# Patient Record
Sex: Female | Born: 1945
Health system: Southern US, Community
[De-identification: ages and names within clinical notes are randomized; demographics above are authoritative.]

## PROBLEM LIST (undated history)

## (undated) DIAGNOSIS — G629 Polyneuropathy, unspecified: Secondary | ICD-10-CM

## (undated) DIAGNOSIS — M1990 Unspecified osteoarthritis, unspecified site: Secondary | ICD-10-CM

## (undated) DIAGNOSIS — E669 Obesity, unspecified: Secondary | ICD-10-CM

## (undated) DIAGNOSIS — K219 Gastro-esophageal reflux disease without esophagitis: Secondary | ICD-10-CM

## (undated) DIAGNOSIS — I10 Essential (primary) hypertension: Secondary | ICD-10-CM

## (undated) DIAGNOSIS — R7303 Prediabetes: Secondary | ICD-10-CM

## (undated) HISTORY — DX: Gastro-esophageal reflux disease without esophagitis: K21.9

## (undated) HISTORY — DX: Essential (primary) hypertension: I10

## (undated) HISTORY — DX: Obesity, unspecified: E66.9

## (undated) HISTORY — DX: Prediabetes: R73.03

## (undated) HISTORY — PX: MOUTH SURGERY: SHX715

## (undated) HISTORY — DX: Unspecified osteoarthritis, unspecified site: M19.90

---

## 2010-12-19 HISTORY — PX: COLONOSCOPY: SHX5424

## 2012-01-10 ENCOUNTER — Observation Stay: Payer: Self-pay | Admitting: Student

## 2012-01-10 LAB — COMPREHENSIVE METABOLIC PANEL
Albumin: 3.9 g/dL (ref 3.4–5.0)
Anion Gap: 14 (ref 7–16)
BUN: 28 mg/dL — ABNORMAL HIGH (ref 7–18)
Bilirubin,Total: 0.4 mg/dL (ref 0.2–1.0)
Calcium, Total: 9.8 mg/dL (ref 8.5–10.1)
Chloride: 102 mmol/L (ref 98–107)
Creatinine: 0.83 mg/dL (ref 0.60–1.30)
EGFR (African American): >60
EGFR (Non-African Amer.): >60
Glucose: 135 mg/dL — ABNORMAL HIGH (ref 65–99)
Potassium: 3.6 mmol/L (ref 3.5–5.1)
SGPT (ALT): 21 U/L
Total Protein: 7.4 g/dL (ref 6.4–8.2)

## 2012-01-10 LAB — CBC
HCT: 41.8 % (ref 35.0–47.0)
HGB: 13.9 g/dL (ref 12.0–16.0)
MCHC: 33.2 g/dL (ref 32.0–36.0)
MCV: 90 fL (ref 80–100)
Platelet: 219 10*3/uL (ref 150–440)
RDW: 12.8 % (ref 11.5–14.5)
WBC: 13 10*3/uL — ABNORMAL HIGH (ref 3.6–11.0)

## 2012-01-11 LAB — BASIC METABOLIC PANEL
Anion Gap: 9 (ref 7–16)
Calcium, Total: 8.6 mg/dL (ref 8.5–10.1)
Chloride: 106 mmol/L (ref 98–107)
Co2: 30 mmol/L (ref 21–32)
Creatinine: 0.68 mg/dL (ref 0.60–1.30)
EGFR (Non-African Amer.): >60
Potassium: 3.5 mmol/L (ref 3.5–5.1)

## 2012-01-11 LAB — CBC WITH DIFFERENTIAL/PLATELET
Eosinophil #: 0.1 10*3/uL (ref 0.0–0.7)
HCT: 39.8 % (ref 35.0–47.0)
Lymphocyte %: 22.9 %
MCH: 29.9 pg (ref 26.0–34.0)
MCHC: 32.7 g/dL (ref 32.0–36.0)
Monocyte #: 0.8 10*3/uL — ABNORMAL HIGH (ref 0.0–0.7)
Neutrophil %: 69.1 %
Platelet: 209 10*3/uL (ref 150–440)
RDW: 13 % (ref 11.5–14.5)

## 2012-01-11 LAB — HEMOGLOBIN A1C: Hemoglobin A1C: 6.1 % (ref 4.2–6.3)

## 2012-07-19 ENCOUNTER — Ambulatory Visit: Payer: Self-pay

## 2012-07-19 LAB — URINALYSIS, COMPLETE
Nitrite: NEGATIVE
Protein: NEGATIVE
Specific Gravity: 1.01 (ref 1.003–1.030)

## 2012-07-20 LAB — URINE CULTURE

## 2013-09-12 ENCOUNTER — Ambulatory Visit: Payer: Self-pay | Admitting: Emergency Medicine

## 2013-09-12 LAB — RAPID STREP-A WITH REFLX: Micro Text Report: NEGATIVE

## 2013-09-15 LAB — BETA STREP CULTURE(ARMC)

## 2014-02-07 DIAGNOSIS — R109 Unspecified abdominal pain: Secondary | ICD-10-CM | POA: Diagnosis not present

## 2014-02-07 DIAGNOSIS — Z880 Allergy status to penicillin: Secondary | ICD-10-CM | POA: Diagnosis not present

## 2014-02-07 DIAGNOSIS — B379 Candidiasis, unspecified: Secondary | ICD-10-CM | POA: Diagnosis not present

## 2014-02-07 DIAGNOSIS — Z885 Allergy status to narcotic agent status: Secondary | ICD-10-CM | POA: Diagnosis not present

## 2014-02-07 DIAGNOSIS — M549 Dorsalgia, unspecified: Secondary | ICD-10-CM | POA: Diagnosis not present

## 2014-02-07 DIAGNOSIS — R319 Hematuria, unspecified: Secondary | ICD-10-CM | POA: Diagnosis not present

## 2014-02-07 DIAGNOSIS — R1012 Left upper quadrant pain: Secondary | ICD-10-CM | POA: Diagnosis not present

## 2014-03-17 DIAGNOSIS — I10 Essential (primary) hypertension: Secondary | ICD-10-CM | POA: Diagnosis not present

## 2014-03-17 DIAGNOSIS — M1710 Unilateral primary osteoarthritis, unspecified knee: Secondary | ICD-10-CM | POA: Diagnosis not present

## 2014-03-17 DIAGNOSIS — F17200 Nicotine dependence, unspecified, uncomplicated: Secondary | ICD-10-CM | POA: Diagnosis not present

## 2014-03-17 DIAGNOSIS — Z885 Allergy status to narcotic agent status: Secondary | ICD-10-CM | POA: Diagnosis not present

## 2014-04-01 DIAGNOSIS — I10 Essential (primary) hypertension: Secondary | ICD-10-CM | POA: Insufficient documentation

## 2014-04-09 DIAGNOSIS — I10 Essential (primary) hypertension: Secondary | ICD-10-CM | POA: Diagnosis not present

## 2014-04-09 DIAGNOSIS — Z885 Allergy status to narcotic agent status: Secondary | ICD-10-CM | POA: Diagnosis not present

## 2014-04-09 DIAGNOSIS — M21169 Varus deformity, not elsewhere classified, unspecified knee: Secondary | ICD-10-CM | POA: Diagnosis not present

## 2014-04-09 DIAGNOSIS — IMO0002 Reserved for concepts with insufficient information to code with codable children: Secondary | ICD-10-CM | POA: Diagnosis not present

## 2014-04-09 DIAGNOSIS — M1710 Unilateral primary osteoarthritis, unspecified knee: Secondary | ICD-10-CM | POA: Diagnosis not present

## 2014-04-09 DIAGNOSIS — Z880 Allergy status to penicillin: Secondary | ICD-10-CM | POA: Diagnosis not present

## 2014-04-09 DIAGNOSIS — Z79899 Other long term (current) drug therapy: Secondary | ICD-10-CM | POA: Diagnosis not present

## 2014-06-12 DIAGNOSIS — Z6841 Body Mass Index (BMI) 40.0 and over, adult: Secondary | ICD-10-CM | POA: Insufficient documentation

## 2014-06-12 DIAGNOSIS — IMO0002 Reserved for concepts with insufficient information to code with codable children: Secondary | ICD-10-CM | POA: Insufficient documentation

## 2014-07-01 DIAGNOSIS — I10 Essential (primary) hypertension: Secondary | ICD-10-CM | POA: Diagnosis not present

## 2014-07-01 DIAGNOSIS — Z6841 Body Mass Index (BMI) 40.0 and over, adult: Secondary | ICD-10-CM | POA: Diagnosis not present

## 2014-07-29 DIAGNOSIS — Z6841 Body Mass Index (BMI) 40.0 and over, adult: Secondary | ICD-10-CM | POA: Diagnosis not present

## 2014-08-12 DIAGNOSIS — Z6841 Body Mass Index (BMI) 40.0 and over, adult: Secondary | ICD-10-CM | POA: Diagnosis not present

## 2014-08-13 IMAGING — CR DG CHEST 2V
1 series · 2 of 2 positions shown · non-contrast
Comparison: none

REASON FOR EXAM: cough and SOB
COMMENTS:   LMP: Post-Menopausal

PROCEDURE:     MDR - MDR CHEST PA(OR AP) AND LATERAL  - September 12, 2013  [DATE]
RESULT:     The lungs are clear. The heart and pulmonary vessels are normal.
The bony and mediastinal structures are unremarkable. There is no effusion.
There is no pneumothorax or evidence of congestive failure.

[Series 1: pa · 0.17mm/px · 2 of 2 slices shown]
[im 1/2]
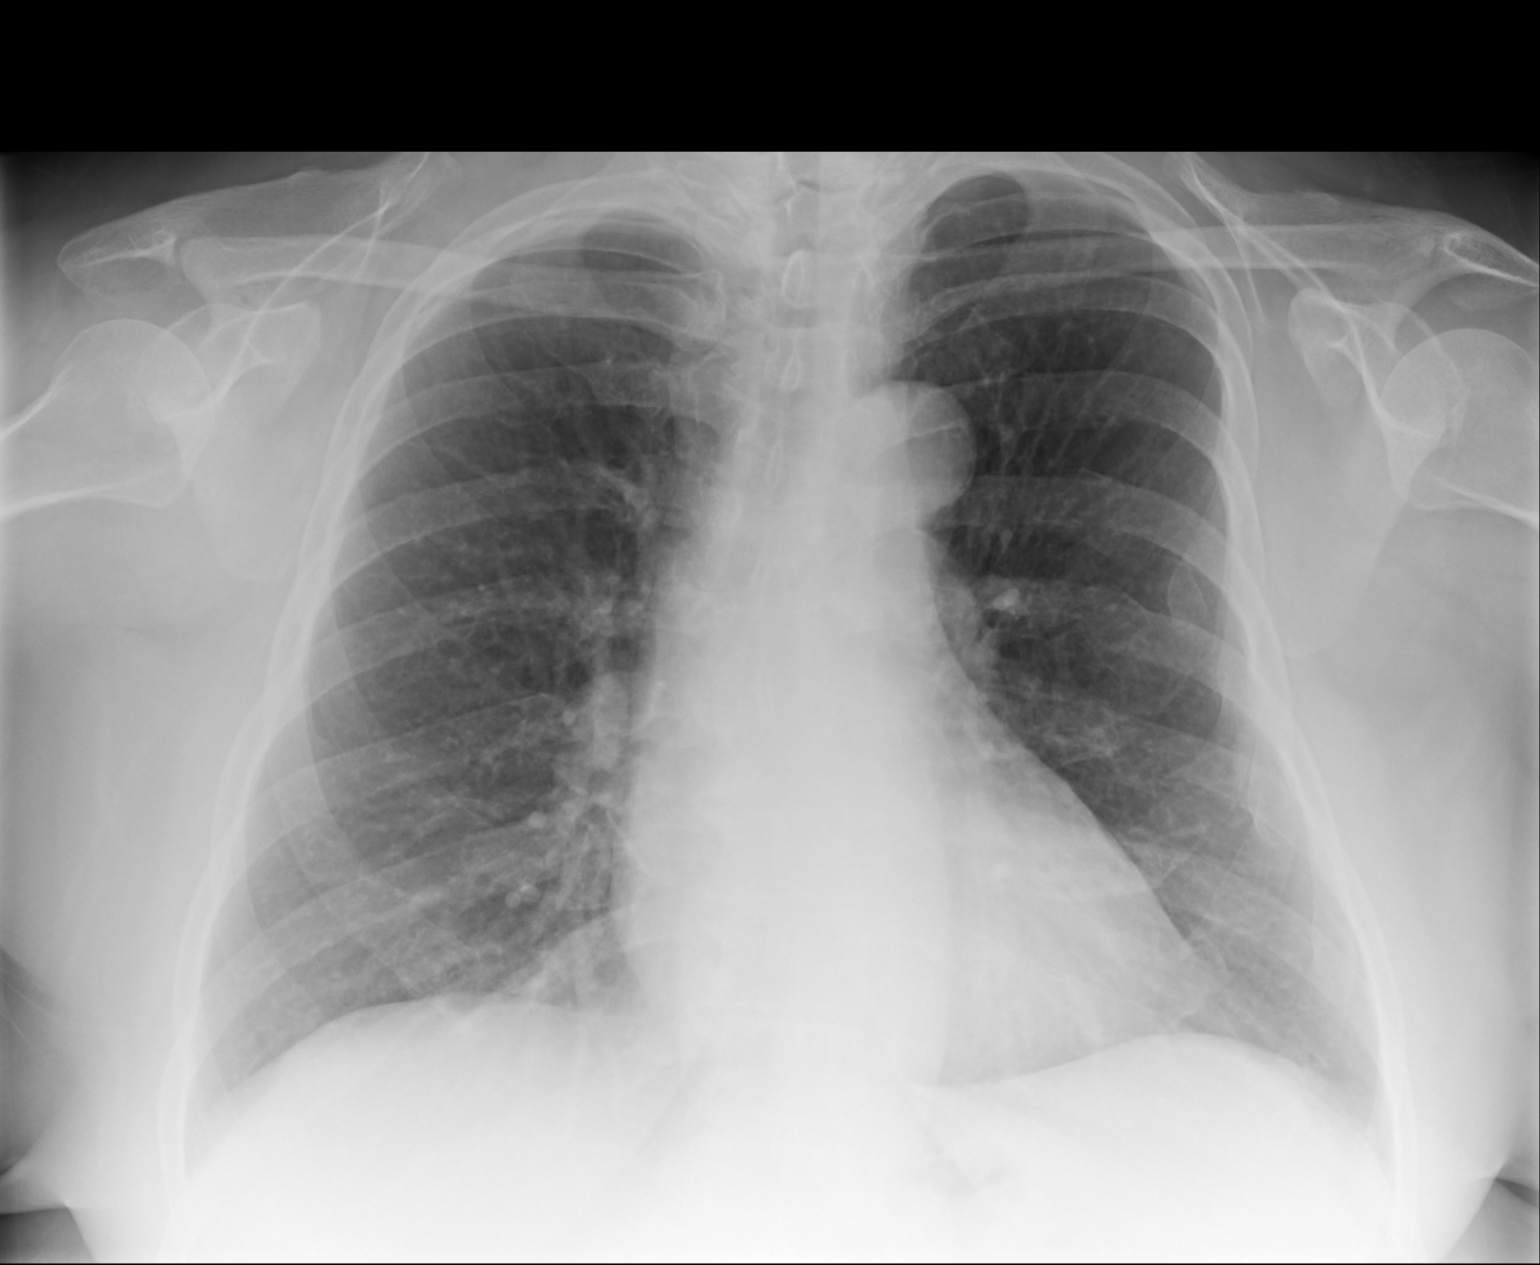
[im 2/2]
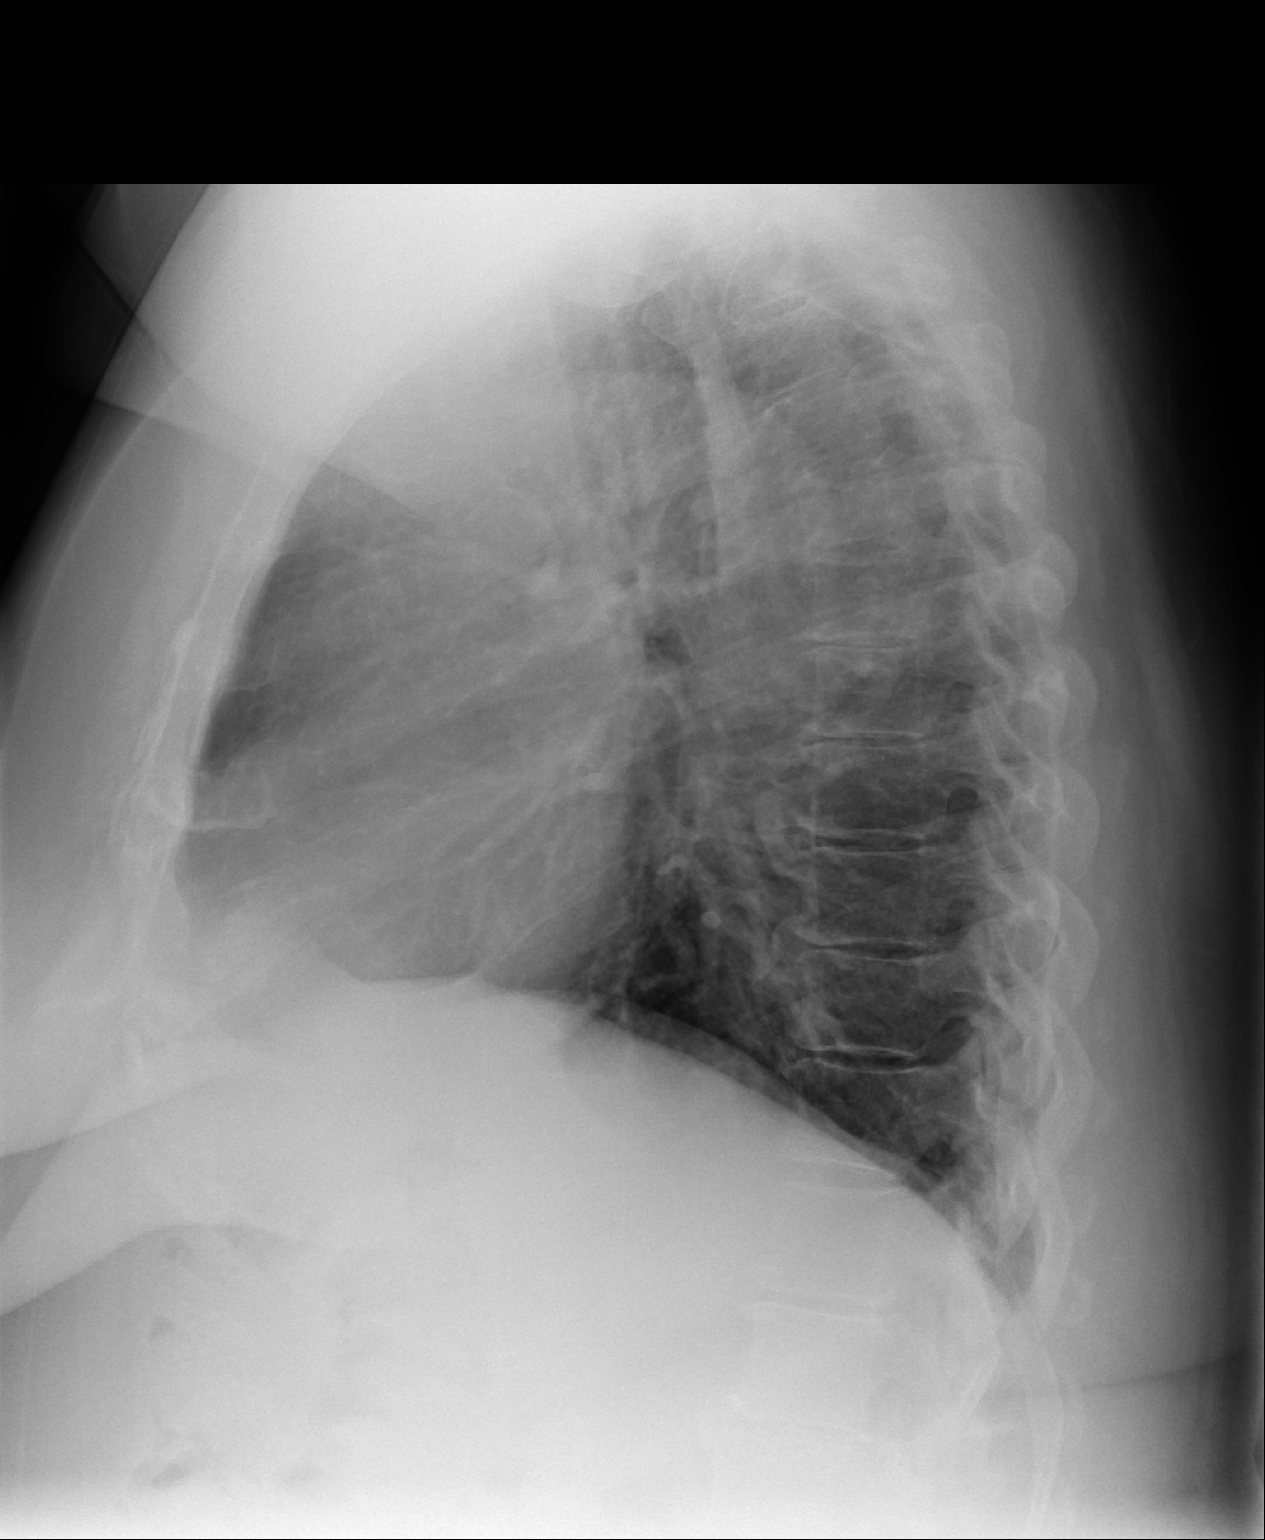

[2 of 2 positions shown; findings below may reference images not displayed]

IMPRESSION: No acute cardiopulmonary disease.

[REDACTED]

## 2014-08-26 DIAGNOSIS — Z6841 Body Mass Index (BMI) 40.0 and over, adult: Secondary | ICD-10-CM | POA: Diagnosis not present

## 2014-10-02 DIAGNOSIS — Z6841 Body Mass Index (BMI) 40.0 and over, adult: Secondary | ICD-10-CM | POA: Diagnosis not present

## 2015-04-12 NOTE — Discharge Summary (Signed)
PATIENT NAME:  Lydia DuncansWATZLAWICK, Juvia G MR#:  [id number] DATE OF BIRTH:  1946-06-30  DATE OF ADMISSION:  01/10/2012 DATE OF DISCHARGE:  01/11/2012  DIAGNOSIS: Benign positional vertigo, dizziness, otitis media.   MEDICATIONS:  1. Meclizine 25 mg every eight hours as needed for dizziness.  2. Fosinopril 40 mg daily.  3. Hydrochlorothiazide 25 mg daily.  4. Nucynta 75 mg daily.  5. Levaquin 500 mg daily for six days.   DIET: Low sodium.   ACTIVITY: As tolerated.   FOLLOWUP: Please follow up with your physician in 1 to 2 weeks upon discharge if symptoms are persistent or recurrent.   HISTORY OF PRESENT ILLNESS: For full history and physical, please see the dictation from the 22nd of January by Dr. Renae GlossWieting. Briefly, this is a 69 year old female who has been having cold symptoms for several weeks and presented with dizziness, nausea, vomiting, and symptoms of vertigo, and was admitted to the hospitalist service for further evaluation and management.   SIGNIFICANT LABS/STUDIES: Glucose on arrival 138, BUN 28, creatinine 0.83. LFTs within normal limits on arrival. Hemoglobin A1c 6.1. Troponin negative times one. Initial WBC 13, hemoglobin 13.9, hematocrit 41.8. WBC on discharge 10.9. CT of the head without contrast showing no acute intracranial abnormality.   HOSPITAL COURSE: The patient was admitted for observation and started on IV fluids, meclizine, and also Levaquin as the patient had evidence of otitis media with some leukocytosis. Likely cause of vertigo is benign positional vertigo from otitis media, which did improve on the second day of hospitalization. Leukocytosis trended down and the patient had no fever. She is to continue Levaquin for another six days as well as meclizine as needed. If the patient's symptoms do not improve or progress, she is to follow up [facility]. This [place]. We set her up with a followup in March at the  [facility] [place]; however, the patient was told that if she has any worsening of the symptoms she can go to the  walk-in clinic and she will comply. Her nausea and vomiting likely is in the setting of above, which resolved. She has had a regular diet and is tolerating it well. For high blood pressure her outpatient medications were continued. As her symptoms have dramatically improved and she was seen by physical therapy, the recommendation was to discharge home. We will go ahead and discharge her with outpatient followup.   CODE STATUS: The patient is FULL CODE.   TOTAL TIME SPENT: 35 minutes.    ____________________________ Krystal EatonShayiq Dyllin Gulley, MD sa:bjt D: 01/11/2012 16:11:42 ET T: 01/12/2012 12:10:00 ET JOB#: [id number]  cc: Krystal EatonShayiq Betti Goodenow, MD, <Dictator> Krystal EatonSHAYIQ Jeray Shugart MD ELECTRONICALLY SIGNED 01/17/2012 18:36

## 2015-04-12 NOTE — H&P (Signed)
PATIENT NAME:  Lydia, Beard MR#:  [id number] DATE OF BIRTH:  Apr 12, 1946  DATE OF ADMISSION:  01/10/2012  PRIMARY CARE PHYSICIAN: None.  CHIEF COMPLAINT: Dizziness.  HISTORY OF PRESENT ILLNESS: This is a 69 year old woman that has been battling a cold for a while. She woke up this morning secondary to dizziness. It occurred while lying down. She felt like she was falling. She tried to stand but had to hold on. She then started to vomit about 12 times with dry heaves. She felt like she was spinning and she had to walk but needed help in order to do so. She has been battling a cold for a little while and feels like she did get over it.   PAST MEDICAL HISTORY:  1. Hypertension. 2. Arthritis in the knees. 3. Herniated disk. 4. Myopia.  5. Obesity.   PAST SURGICAL HISTORY: Dental surgery.   ALLERGIES: Penicillin and codeine; codeine causes nausea.   MEDICATIONS:  1. Fosinopril 20 mg daily.  2. Hydrochlorothiazide 25 mg daily. 3. Nucynta, unknown dose daily.   SOCIAL HISTORY: No smoking. Occasional alcoholic beverage. No drug use. Currently unemployed with recent move into the area.   FAMILY HISTORY: Mother died at 84 of unknown cause. She did have vertigo and diabetes. Father died at 62 of a myocardial infarction and had hypertension and diabetes.  REVIEW OF SYSTEMS: CONSTITUTIONAL: Positive for chills. Positive for sweats with the vomiting. No fever. Positive for weight loss over the past year. Positive for fatigue. EYES: She does have glasses. EARS, NOSE, MOUTH, AND THROAT: Positive for runny nose. No sore throat. No difficulty swallowing. CARDIOVASCULAR: No chest pain. No palpitations. RESPIRATORY: Positive for cough with clear phlegm. GASTROINTESTINAL: Positive for nausea. Positive for vomiting. No hematemesis. No abdominal pain. No bright red blood per rectum. No melena. No constipation. No diarrhea. GENITOURINARY: No burning on urination. No hematuria. MUSCULOSKELETAL: Positive for  arthritis pain in the knees and ankles. NEUROLOGIC: No fainting or blackouts, but dizziness. PSYCHIATRIC: Positive for anxiety and depression. ENDOCRINE: No thyroid problems. HEMATOLOGIC/LYMPHATIC: No anemia. No easy bruising or bleeding.   PHYSICAL EXAMINATION:   VITAL SIGNS: Temperature 96, pulse 79, respirations 16, blood pressure 143/79, and pulse oximetry 91%.   GENERAL: No respiratory distress.   EYES: Conjunctivae and lids normal. Pupils equal, round, and reactive to light. Extraocular muscles intact with slight nystagmus.   EARS, NOSE, MOUTH, AND THROAT: Tympanic membranes bulging and erythematous bilaterally. Nasal mucosa no erythema. Throat no erythema. No exudate seen. Lips and gums normal.   NECK: No JVD. No bruits. No lymphadenopathy. No thyromegaly. No thyroid nodules palpated.   LUNGS: Lungs are clear to auscultation. No use of accessory muscles to breathe. No rhonchi, rales, or wheeze heard.   HEART: S1 and S2 normal. No gallops, rubs, or murmurs heard. Carotid upstroke 2+ bilaterally. No bruits. Dorsalis pedis pulses 2+ bilaterally. Trace edema of the lower extremities.   ABDOMEN: Soft and nontender. No organomegaly/splenomegaly. Normoactive bowel sounds. No masses felt.   LYMPHATIC: No lymph nodes in the neck.   MUSCULOSKELETAL: No clubbing or cyanosis. Trace edema.  SKIN: No rashes or ulcers seen.   NEUROLOGIC: Cranial nerves II through XII are grossly intact. Deep tendon reflexes 1+ in bilateral lower extremities. Babinski negative. Just sitting the patient up brought on the dizziness and had to lie down very quickly.   PSYCHIATRIC: The patient is oriented to person, place, and time.   LABS/STUDIES: CT scan of the head showed no acute intracranial abnormality.  White blood cell count 13.0, hemoglobin and hematocrit 13.9 and 41.8, and platelet count 219. Glucose 135, BUN 28, creatinine 0.83, sodium 144, potassium 3.6, chloride 102, CO2 28, and calcium 9.8. Liver  function tests are normal range. Troponin negative.   ASSESSMENT AND PLAN:  1. Vertigo, most likely benign positional vertigo caused by otitis media: We will give Levaquin 500 mg p.o. daily. We will give IV fluid hydration and meclizine standing dose. We will get a physical therapy evaluation since the patient is hardly able to lift her head up off the pillow. PRN nausea medications.  2. Nausea and vomiting: We will give p.r.n. Zofran. 3. Leukocytosis, most likely secondary to the vomiting.  4. Hypertension: Continue fosinopril and hydrochlorothiazide.  5. Arthritis: She is on Nucynta.  6. Obesity with BMI of 45.2: The patient is trying to lose weight.  7. Impaired fasting glucose: We will check a Hemoglobin A1c.  TIME SPENT ON ADMISSION: 50 minutes. ____________________________ Herschell Dimesichard J. Renae GlossWieting, MD rjw:slb D: 01/10/2012 11:10:45 ET     T: 01/10/2012 11:49:07 ET       JOB#: [id number] cc: Herschell Dimesichard J. Renae GlossWieting, MD, <Dictator> Salley ScarletICHARD J Amiree No MD ELECTRONICALLY SIGNED 01/12/2012 15:57

## 2015-09-16 ENCOUNTER — Ambulatory Visit (INDEPENDENT_AMBULATORY_CARE_PROVIDER_SITE_OTHER): Payer: Medicare Other | Admitting: Family Medicine

## 2015-09-16 ENCOUNTER — Encounter: Payer: Self-pay | Admitting: Family Medicine

## 2015-09-16 VITALS — BP 130/100 | HR 80 | Ht 67.0 in | Wt 336.0 lb

## 2015-09-16 DIAGNOSIS — R7309 Other abnormal glucose: Secondary | ICD-10-CM | POA: Diagnosis not present

## 2015-09-16 DIAGNOSIS — H6982 Other specified disorders of Eustachian tube, left ear: Secondary | ICD-10-CM | POA: Diagnosis not present

## 2015-09-16 DIAGNOSIS — F32A Depression, unspecified: Secondary | ICD-10-CM

## 2015-09-16 DIAGNOSIS — Z23 Encounter for immunization: Secondary | ICD-10-CM

## 2015-09-16 DIAGNOSIS — K219 Gastro-esophageal reflux disease without esophagitis: Secondary | ICD-10-CM

## 2015-09-16 DIAGNOSIS — E559 Vitamin D deficiency, unspecified: Secondary | ICD-10-CM | POA: Diagnosis not present

## 2015-09-16 DIAGNOSIS — I10 Essential (primary) hypertension: Secondary | ICD-10-CM

## 2015-09-16 DIAGNOSIS — F329 Major depressive disorder, single episode, unspecified: Secondary | ICD-10-CM

## 2015-09-16 DIAGNOSIS — M1710 Unilateral primary osteoarthritis, unspecified knee: Secondary | ICD-10-CM | POA: Insufficient documentation

## 2015-09-16 DIAGNOSIS — R739 Hyperglycemia, unspecified: Secondary | ICD-10-CM | POA: Diagnosis not present

## 2015-09-16 DIAGNOSIS — M170 Bilateral primary osteoarthritis of knee: Secondary | ICD-10-CM | POA: Diagnosis not present

## 2015-09-16 DIAGNOSIS — R7303 Prediabetes: Secondary | ICD-10-CM

## 2015-09-16 DIAGNOSIS — M179 Osteoarthritis of knee, unspecified: Secondary | ICD-10-CM | POA: Insufficient documentation

## 2015-09-16 DIAGNOSIS — R600 Localized edema: Secondary | ICD-10-CM

## 2015-09-16 MED ORDER — HYDROCHLOROTHIAZIDE 25 MG PO TABS: 25.0000 mg | ORAL_TABLET | Freq: Every day | ORAL | Status: DC

## 2015-09-16 MED ORDER — ACETAMINOPHEN 500 MG PO TABS: 1000.0000 mg | ORAL_TABLET | Freq: Two times a day (BID) | ORAL | Status: DC

## 2015-09-16 MED ORDER — FOSINOPRIL SODIUM 40 MG PO TABS: 40.0000 mg | ORAL_TABLET | Freq: Every day | ORAL | Status: DC

## 2015-09-17 DIAGNOSIS — F329 Major depressive disorder, single episode, unspecified: Secondary | ICD-10-CM | POA: Insufficient documentation

## 2015-09-17 DIAGNOSIS — R7303 Prediabetes: Secondary | ICD-10-CM | POA: Insufficient documentation

## 2015-09-17 DIAGNOSIS — F32A Depression, unspecified: Secondary | ICD-10-CM | POA: Insufficient documentation

## 2015-09-17 LAB — COMPREHENSIVE METABOLIC PANEL
ALK PHOS: 93 IU/L (ref 39–117)
ALT: 19 IU/L (ref 0–32)
AST: 20 IU/L (ref 0–40)
Albumin/Globulin Ratio: 1.7 (ref 1.1–2.5)
Albumin: 4.7 g/dL (ref 3.6–4.8)
BUN/Creatinine Ratio: 27 — ABNORMAL HIGH (ref 11–26)
BUN: 25 mg/dL (ref 8–27)
Bilirubin Total: 0.3 mg/dL (ref 0.0–1.2)
CO2: 25 mmol/L (ref 18–29)
CREATININE: 0.92 mg/dL (ref 0.57–1.00)
Calcium: 10.1 mg/dL (ref 8.7–10.3)
Chloride: 101 mmol/L (ref 97–108)
GFR calc Af Amer: 75 mL/min/{1.73_m2} (ref >59)
GFR calc non Af Amer: 65 mL/min/{1.73_m2} (ref >59)
GLUCOSE: 100 mg/dL — AB (ref 65–99)
Globulin, Total: 2.8 g/dL (ref 1.5–4.5)
Potassium: 4.7 mmol/L (ref 3.5–5.2)
Sodium: 143 mmol/L (ref 134–144)
Total Protein: 7.5 g/dL (ref 6.0–8.5)

## 2015-09-17 LAB — CBC
HEMATOCRIT: 44.8 % (ref 34.0–46.6)
Hemoglobin: 15.1 g/dL (ref 11.1–15.9)
MCH: 30.5 pg (ref 26.6–33.0)
MCHC: 33.7 g/dL (ref 31.5–35.7)
MCV: 91 fL (ref 79–97)
Platelets: 284 10*3/uL (ref 150–379)
RBC: 4.95 x10E6/uL (ref 3.77–5.28)
RDW: 13.3 % (ref 12.3–15.4)
WBC: 12.7 10*3/uL — AB (ref 3.4–10.8)

## 2015-09-17 LAB — VITAMIN D 25 HYDROXY (VIT D DEFICIENCY, FRACTURES): Vit D, 25-Hydroxy: 30.8 ng/mL (ref 30.0–100.0)

## 2015-09-17 LAB — HEMOGLOBIN A1C
Est. average glucose Bld gHb Est-mCnc: 131 mg/dL
Hgb A1c MFr Bld: 6.2 % — ABNORMAL HIGH (ref 4.8–5.6)

## 2015-09-17 LAB — TSH: TSH: 2.13 u[IU]/mL (ref 0.450–4.500)

## 2015-09-17 NOTE — Progress Notes (Signed)
Date:  09/16/2015   Name:  Lydia Beard   DOB:  1946-11-03   MRN:  [id number]  PCP:  Schuyler Amor, MD    Chief Complaint: Establish Care; Hypertension; Foot Swelling; and Ear Fullness   History of Present Illness:  This is a 69 y.o. female with MMP to establish care, moved here from [place] in May. Currently c/o L ear discomfort  For few days and and BLE swelling L>R over past 9 months, gets a little better overnight, doubling HCTZ did not help. Hx obesity, worked with nutritionist to lose 20# but gained all back, discouraged, ortho wants to replace knees but said she needs to lose 80# first. Hx HTN generally well controlled. Hx GERD on daily Prilosec, gets recurrent sxs if tries to stop. Takes St. John's Wort for mood which seems to help. Uses Zanaflex prn only. Brings in article about risks of NSAIDS in elderly. Lipids ok in past. A1c 6.1% in 2013. Told to take vit D in past. Takes daily asa but no hx CV dz. Tetanus status unknown, no flu imm this season  Review of Systems:  Review of Systems  Constitutional: Negative for chills and fatigue.  HENT: Positive for rhinorrhea. Negative for ear discharge, sore throat and trouble swallowing.   Eyes: Negative for pain.  Respiratory: Negative for shortness of breath.   Cardiovascular: Negative for chest pain.  Gastrointestinal: Negative for abdominal pain.  Endocrine: Negative for polyuria.  Genitourinary: Negative for difficulty urinating.  Skin: Negative for rash.  Neurological: Negative for dizziness, syncope and light-headedness.    Patient Active Problem List   Diagnosis Date Noted  . Obesity, morbid, BMI 50 or higher 09/16/2015  . OA (osteoarthritis) of knee 09/16/2015  . GERD (gastroesophageal reflux disease) 09/16/2015  . Body mass index of 60 or higher 06/12/2014  . BP (high blood pressure) 04/01/2014    Prior to Admission medications   Medication Sig Start Date End Date Taking? Authorizing Provider  Ascorbic Acid (VITAMIN  C) 1000 MG tablet Take 1,000 mg by mouth daily.   Yes Historical Provider, MD  fosinopril (MONOPRIL) 40 MG tablet Take 1 tablet (40 mg total) by mouth daily. 09/16/15  Yes Schuyler Amor, MD  hydrochlorothiazide (HYDRODIURIL) 25 MG tablet Take 1 tablet (25 mg total) by mouth daily. 09/16/15  Yes Schuyler Amor, MD  Multiple Vitamin (MULTIVITAMIN) tablet Take 1 tablet by mouth daily.   Yes Historical Provider, MD  [facility] Wort (V-R [facility] JOHNS WORT) 300 MG TABS Take 1 tablet by mouth daily.   Yes Historical Provider, MD  tiZANidine (ZANAFLEX) 4 MG tablet Take 2 mg by mouth every 6 (six) hours as needed.   Yes Historical Provider, MD  acetaminophen (TYLENOL) 500 MG tablet Take 2 tablets (1,000 mg total) by mouth 2 (two) times daily. 09/16/15   Schuyler Amor, MD    Allergies  Allergen Reactions  . Codeine   . Erythromycin   . Penicillins     Past Surgical History  Procedure Laterality Date  . Mouth surgery    . Colonoscopy  2012    cleared for 5 yrs    Social History  Substance Use Topics  . Smoking status: Former Smoker    Quit date: 12/19/1966  . Smokeless tobacco: None  . Alcohol Use: 0.0 oz/week    0 Standard drinks or equivalent per week    Family History  Problem Relation Age of Onset  . Cancer Mother   . Diabetes Mother   . Diabetes  Father   . Heart disease Father   . Hypertension Father     Medication list has been reviewed and updated.  Physical Examination: BP 130/100 mmHg  Pulse 80  Ht  (1.702 m)  Wt 336 lb (152.409 kg)  BMI 52.61 kg/m2  Physical Exam  Constitutional: She is oriented to person, place, and time. She appears well-developed and well-nourished. No distress.  HENT:  Head: Normocephalic.  Right Ear: External ear normal.  Left Ear: External ear normal.  Mouth/Throat: Oropharynx is clear and moist.  L TM retracted compared to R  Eyes: Conjunctivae and EOM are normal. Pupils are equal, round, and reactive to light.  Neck: Neck supple. No  thyromegaly present.  Cardiovascular: Normal rate, regular rhythm and normal heart sounds.   Pulmonary/Chest: Effort normal and breath sounds normal.  Abdominal: Soft. She exhibits no distension and no mass. There is no tenderness.  Musculoskeletal:  Trace L ankle edema Walks with cane  Lymphadenopathy:    She has no cervical adenopathy.  Neurological: She is alert and oriented to person, place, and time. Coordination normal.  Skin: Skin is warm and dry. No rash noted.  Psychiatric: She has a normal mood and affect. Her behavior is normal.    Assessment and Plan:  1. Essential hypertension Marginal control today, d/c asa as no known CV dz, consider BB if still elevated next visit - Comprehensive metabolic panel - CBC  2. Obesity, morbid, BMI 50 or higher Discussed weight loss, non-weight bearing exercise, offered MNT referral, pt declines for now  3. Gastroesophageal reflux disease, esophagitis presence not specified Discussed LT risks of PPI use, will try to wean off or switch to Zantac  4. Primary osteoarthritis of both knees Ortho recommends replacement but needs to lose 80# first, recommend Tylenol bid instead of NSAIDS  5. Hyperglycemia Recheck a1c, suspect prediabetes - HgB A1c  6. Vitamin D deficiency Likely given weight and immobility - Vitamin D (25 hydroxy)  7. Edema of both legs Suspect lymphatic due to weight, not symptomatic at present - TSH  8. ETD (eustachian tube dysfunction), left Afrin NS x 3d only  9. Depression Stable, continue St. John's Wort  10. Delayed imms Tdap today, declines flu, consider pneumococcal imms next visit  Return in about 4 weeks (around 10/14/2015).  Dionne Ano. Kingsley Spittle MD [facility]  09/17/2015

## 2015-09-17 NOTE — Addendum Note (Signed)
Addended by: Schuyler Amor on: 09/17/2015 10:45 AM   Modules accepted: Kipp Brood

## 2015-10-14 ENCOUNTER — Ambulatory Visit: Payer: Medicare Other | Admitting: Family Medicine

## 2016-05-25 ENCOUNTER — Encounter: Payer: Self-pay | Admitting: Family Medicine

## 2016-05-25 ENCOUNTER — Ambulatory Visit (INDEPENDENT_AMBULATORY_CARE_PROVIDER_SITE_OTHER): Payer: Medicare Other | Admitting: Family Medicine

## 2016-05-25 VITALS — BP 138/82 | HR 78 | Resp 16 | Ht 67.0 in | Wt 329.0 lb

## 2016-05-25 DIAGNOSIS — Z6841 Body Mass Index (BMI) 40.0 and over, adult: Secondary | ICD-10-CM

## 2016-05-25 DIAGNOSIS — K219 Gastro-esophageal reflux disease without esophagitis: Secondary | ICD-10-CM | POA: Diagnosis not present

## 2016-05-25 DIAGNOSIS — M170 Bilateral primary osteoarthritis of knee: Secondary | ICD-10-CM | POA: Diagnosis not present

## 2016-05-25 DIAGNOSIS — F329 Major depressive disorder, single episode, unspecified: Secondary | ICD-10-CM

## 2016-05-25 DIAGNOSIS — F32A Depression, unspecified: Secondary | ICD-10-CM

## 2016-05-25 DIAGNOSIS — R7303 Prediabetes: Secondary | ICD-10-CM | POA: Diagnosis not present

## 2016-05-25 DIAGNOSIS — I10 Essential (primary) hypertension: Secondary | ICD-10-CM

## 2016-05-25 MED ORDER — CLOTRIMAZOLE-BETAMETHASONE 1-0.05 % EX CREA: 1.0000 "application " | TOPICAL_CREAM | Freq: Two times a day (BID) | CUTANEOUS | Status: DC

## 2016-05-27 NOTE — Progress Notes (Signed)
Date:  05/25/2016   Name:  Lydia Beard   DOB:  July 12, 1946   MRN:  [id number]  PCP:  Schuyler Amor, MD    Chief Complaint: Rash   History of Present Illness:  This is a 70 y.o. female seen in nine month f/u from initial visit. Interested in nutrition referral. Attempt to convert PPI to Zantac unsuccessful due to recurrent sxs, tried for two weeks. OA well controlled on Tylenol bid. Taking vit D supp. Blood work last visit showed prediabetes.   Review of Systems:  Review of Systems  Constitutional: Negative for fever and fatigue.  Respiratory: Negative for cough and shortness of breath.   Cardiovascular: Negative for chest pain and leg swelling.  Endocrine: Negative for polyuria.  Genitourinary: Negative for difficulty urinating.  Neurological: Negative for syncope and light-headedness.    Patient Active Problem List   Diagnosis Date Noted  . Depression 09/17/2015  . Prediabetes 09/17/2015  . OA (osteoarthritis) of knee 09/16/2015  . GERD (gastroesophageal reflux disease) 09/16/2015  . Body mass index (BMI) of 50-59.9 in adult (HCC) 06/12/2014  . BP (high blood pressure) 04/01/2014    Prior to Admission medications   Medication Sig Start Date End Date Taking? Authorizing Provider  acetaminophen (TYLENOL) 500 MG tablet Take 2 tablets (1,000 mg total) by mouth 2 (two) times daily. 09/16/15  Yes Schuyler Amor, MD  Ascorbic Acid (VITAMIN C) 1000 MG tablet Take 1,000 mg by mouth daily.   Yes Historical Provider, MD  fosinopril (MONOPRIL) 40 MG tablet Take 1 tablet (40 mg total) by mouth daily. 09/16/15  Yes Schuyler Amor, MD  hydrochlorothiazide (HYDRODIURIL) 25 MG tablet Take 1 tablet (25 mg total) by mouth daily. 09/16/15  Yes Schuyler Amor, MD  Multiple Vitamin (MULTIVITAMIN) tablet Take 1 tablet by mouth daily.   Yes Historical Provider, MD  [facility] Wort (V-R [facility] JOHNS WORT) 300 MG TABS Take 1 tablet by mouth daily.   Yes Historical Provider, MD  tiZANidine (ZANAFLEX) 4 MG  tablet Take 2 mg by mouth every 6 (six) hours as needed.   Yes Historical Provider, MD  clotrimazole-betamethasone (LOTRISONE) cream Apply 1 application topically 2 (two) times daily. 05/25/16   Schuyler Amor, MD    Allergies  Allergen Reactions  . Codeine   . Erythromycin   . Penicillins     Past Surgical History  Procedure Laterality Date  . Mouth surgery    . Colonoscopy  2012    cleared for 5 yrs    Social History  Substance Use Topics  . Smoking status: Former Smoker    Quit date: 12/19/1966  . Smokeless tobacco: None  . Alcohol Use: 0.0 oz/week    0 Standard drinks or equivalent per week    Family History  Problem Relation Age of Onset  . Cancer Mother   . Diabetes Mother   . Diabetes Father   . Heart disease Father   . Hypertension Father     Medication list has been reviewed and updated.  Physical Examination: BP 138/82 mmHg  Pulse 78  Resp 16  Ht  (1.702 m)  Wt 329 lb (149.233 kg)  BMI 51.52 kg/m2  SpO2 96%  Physical Exam  Constitutional: She appears well-developed and well-nourished.  Cardiovascular: Normal rate, regular rhythm and normal heart sounds.   Pulmonary/Chest: Effort normal and breath sounds normal.  Musculoskeletal: She exhibits no edema.  Neurological: She is alert.  Skin: Skin is warm and dry.  Psychiatric: She has a normal  mood and affect. Her behavior is normal.  Nursing note and vitals reviewed.   Assessment and Plan:  1. Essential hypertension Well controlled on Monopril/HCTZ  2. Gastroesophageal reflux disease, esophagitis presence not specified Attempt to convert to Zantac unsuccessful, back on chronic Prilosec  3. Primary osteoarthritis of both knees Well controlled on Tylenol bid  4. Depression Well controlled on St. John's Wort  5. Prediabetes NCS diet discussed, monitor a1c  6. Body mass index (BMI) of 50-59.9 in adult (HCC) Weight down 7# - Amb ref to Medical Nutrition Therapy-MNT  Return in about 6  months (around 11/24/2016).  [name], Jr. MD [facility]  05/27/2016

## 2016-06-08 ENCOUNTER — Ambulatory Visit (INDEPENDENT_AMBULATORY_CARE_PROVIDER_SITE_OTHER): Payer: Medicare Other | Admitting: Family Medicine

## 2016-06-08 ENCOUNTER — Encounter: Payer: Self-pay | Admitting: Family Medicine

## 2016-06-08 VITALS — BP 120/98 | HR 88 | Ht 67.0 in | Wt 326.0 lb

## 2016-06-08 DIAGNOSIS — Z6841 Body Mass Index (BMI) 40.0 and over, adult: Secondary | ICD-10-CM | POA: Diagnosis not present

## 2016-06-08 DIAGNOSIS — R7303 Prediabetes: Secondary | ICD-10-CM

## 2016-06-08 DIAGNOSIS — I10 Essential (primary) hypertension: Secondary | ICD-10-CM | POA: Diagnosis not present

## 2016-06-08 DIAGNOSIS — K648 Other hemorrhoids: Secondary | ICD-10-CM

## 2016-06-08 DIAGNOSIS — K644 Residual hemorrhoidal skin tags: Secondary | ICD-10-CM

## 2016-06-08 MED ORDER — HYDROCORTISONE 2.5 % RE CREA: 1.0000 "application " | TOPICAL_CREAM | Freq: Two times a day (BID) | RECTAL | Status: DC

## 2016-06-08 NOTE — Progress Notes (Addendum)
Date:  06/08/2016   Name:  Lydia Beard   DOB:  May 01, 1946   MRN:  [id number]  PCP:  Schuyler AmorWilliam Vail Basista, MD    Chief Complaint: Rectal Bleeding   History of Present Illness:  This is a 70 y.o. female with painless rectal bleeding noted today. Had to strain more than usual with BM before bleeding noted. Having occ R sided positional back/side pain past two weeks. Had normal colonoscopy 5 yrs ago.  Review of Systems:  Review of Systems  Constitutional: Negative for fever and appetite change.  Gastrointestinal: Negative for nausea, vomiting, diarrhea and constipation.  Endocrine: Negative for polyuria.  Genitourinary: Negative for difficulty urinating.  Neurological: Negative for syncope and light-headedness.    Patient Active Problem List   Diagnosis Date Noted  . Depression 09/17/2015  . Prediabetes 09/17/2015  . OA (osteoarthritis) of knee 09/16/2015  . GERD (gastroesophageal reflux disease) 09/16/2015  . Body mass index (BMI) of 50-59.9 in adult (HCC) 06/12/2014  . BP (high blood pressure) 04/01/2014    Prior to Admission medications   Medication Sig Start Date End Date Taking? Authorizing Provider  acetaminophen (TYLENOL) 500 MG tablet Take 2 tablets (1,000 mg total) by mouth 2 (two) times daily. 09/16/15  Yes Schuyler AmorWilliam Ama Mcmaster, MD  Ascorbic Acid (VITAMIN C) 1000 MG tablet Take 1,000 mg by mouth daily.   Yes Historical Provider, MD  clotrimazole-betamethasone (LOTRISONE) cream Apply 1 application topically 2 (two) times daily. 05/25/16  Yes Schuyler AmorWilliam Kyzer Blowe, MD  fosinopril (MONOPRIL) 40 MG tablet Take 1 tablet (40 mg total) by mouth daily. 09/16/15  Yes Schuyler AmorWilliam Taviana Westergren, MD  hydrochlorothiazide (HYDRODIURIL) 25 MG tablet Take 1 tablet (25 mg total) by mouth daily. 09/16/15  Yes Schuyler AmorWilliam Keegen Heffern, MD  Multiple Vitamin (MULTIVITAMIN) tablet Take 1 tablet by mouth daily.   Yes Historical Provider, MD  [facility] Wort (V-R [facility] JOHNS WORT) 300 MG TABS Take 1 tablet by mouth daily.   Yes Historical  Provider, MD  hydrocortisone (ANUSOL-HC) 2.5 % rectal cream Place 1 application rectally 2 (two) times daily. 06/08/16   Schuyler AmorWilliam Aaiden Depoy, MD  tiZANidine (ZANAFLEX) 4 MG tablet Take 2 mg by mouth every 6 (six) hours as needed. Reported on 06/08/2016    Historical Provider, MD    Allergies  Allergen Reactions  . Codeine   . Erythromycin   . Penicillins     Past Surgical History  Procedure Laterality Date  . Mouth surgery    . Colonoscopy  2012    cleared for 5 yrs    Social History  Substance Use Topics  . Smoking status: Former Smoker    Quit date: 12/19/1966  . Smokeless tobacco: None  . Alcohol Use: 0.0 oz/week    0 Standard drinks or equivalent per week    Family History  Problem Relation Age of Onset  . Cancer Mother   . Diabetes Mother   . Diabetes Father   . Heart disease Father   . Hypertension Father     Medication list has been reviewed and updated.  Physical Examination: BP 120/98 mmHg  Pulse 88  Ht 5\' 7"  (1.702 m)  Wt 326 lb (147.873 kg)  BMI 51.05 kg/m2  Physical Exam  Constitutional: She appears well-developed and well-nourished.  Cardiovascular: Normal rate, regular rhythm and normal heart sounds.   Pulmonary/Chest: Effort normal and breath sounds normal.  Abdominal: Soft. She exhibits no distension and no mass. There is no tenderness.  Genitourinary:  Small ext hemorrhoids noted Digital rectal exam showed  small amt BRB, no mass Patient examined with CNA Delice Bison as chaperone  Neurological: She is alert.  Skin: Skin is warm and dry.  Psychiatric: She has a normal mood and affect. Her behavior is normal.  Nursing note and vitals reviewed.   Assessment and Plan:  1. External hemorrhoid, bleeding Anusol HC cream bid - CBC  2. Essential hypertension Repeat BP 120/92, adequate control on Monopril/HCTZ  3. Prediabetes A1c 6.0% in September - HgB A1c  4. Body mass index (BMI) of 50-59.9 in adult (HCC) Weight down 3# past 2 weeks, MNT referral  pending  Return in about 6 months (around 12/08/2016), or if symptoms worsen or fail to improve.  Dionne Ano. Kingsley Spittle MD [facility]  06/08/2016

## 2016-06-08 NOTE — Patient Instructions (Signed)

## 2016-06-09 LAB — CBC
HEMOGLOBIN: 15 g/dL (ref 11.1–15.9)
Hematocrit: 44.5 % (ref 34.0–46.6)
MCH: 29.5 pg (ref 26.6–33.0)
MCHC: 33.7 g/dL (ref 31.5–35.7)
MCV: 88 fL (ref 79–97)
Platelets: 322 10*3/uL (ref 150–379)
RBC: 5.08 x10E6/uL (ref 3.77–5.28)
RDW: 13.4 % (ref 12.3–15.4)
WBC: 13.1 10*3/uL — ABNORMAL HIGH (ref 3.4–10.8)

## 2016-06-09 LAB — HEMOGLOBIN A1C
ESTIMATED AVERAGE GLUCOSE: 120 mg/dL
HEMOGLOBIN A1C: 5.8 % — AB (ref 4.8–5.6)

## 2016-09-10 ENCOUNTER — Other Ambulatory Visit: Payer: Self-pay | Admitting: Family Medicine

## 2016-09-27 DIAGNOSIS — R51 Headache: Secondary | ICD-10-CM | POA: Diagnosis not present

## 2016-09-27 DIAGNOSIS — R609 Edema, unspecified: Secondary | ICD-10-CM | POA: Diagnosis not present

## 2016-09-27 DIAGNOSIS — M7989 Other specified soft tissue disorders: Secondary | ICD-10-CM | POA: Diagnosis not present

## 2016-09-27 DIAGNOSIS — R109 Unspecified abdominal pain: Secondary | ICD-10-CM | POA: Diagnosis not present

## 2016-09-27 DIAGNOSIS — R0600 Dyspnea, unspecified: Secondary | ICD-10-CM | POA: Diagnosis not present

## 2016-09-27 DIAGNOSIS — E669 Obesity, unspecified: Secondary | ICD-10-CM | POA: Diagnosis not present

## 2016-09-27 DIAGNOSIS — R2242 Localized swelling, mass and lump, left lower limb: Secondary | ICD-10-CM | POA: Diagnosis not present

## 2016-09-27 DIAGNOSIS — S2242XA Multiple fractures of ribs, left side, initial encounter for closed fracture: Secondary | ICD-10-CM | POA: Diagnosis not present

## 2016-09-27 DIAGNOSIS — R0602 Shortness of breath: Secondary | ICD-10-CM | POA: Diagnosis not present

## 2016-09-27 DIAGNOSIS — R079 Chest pain, unspecified: Secondary | ICD-10-CM | POA: Diagnosis not present

## 2016-09-27 DIAGNOSIS — Z79899 Other long term (current) drug therapy: Secondary | ICD-10-CM | POA: Diagnosis not present

## 2016-09-27 DIAGNOSIS — R0789 Other chest pain: Secondary | ICD-10-CM | POA: Diagnosis not present

## 2016-09-29 ENCOUNTER — Encounter: Payer: Self-pay | Admitting: Family Medicine

## 2016-09-29 ENCOUNTER — Ambulatory Visit (INDEPENDENT_AMBULATORY_CARE_PROVIDER_SITE_OTHER): Payer: Medicare Other | Admitting: Family Medicine

## 2016-09-29 VITALS — BP 138/64 | HR 72 | Ht 67.0 in | Wt 329.0 lb

## 2016-09-29 DIAGNOSIS — R079 Chest pain, unspecified: Secondary | ICD-10-CM | POA: Diagnosis not present

## 2016-09-29 DIAGNOSIS — F329 Major depressive disorder, single episode, unspecified: Secondary | ICD-10-CM

## 2016-09-29 MED ORDER — SERTRALINE HCL 50 MG PO TABS: 50.0000 mg | ORAL_TABLET | Freq: Every day | ORAL | 3 refills | Status: DC

## 2016-09-29 NOTE — Progress Notes (Signed)
Name: Lydia Beard   MRN: [id number]    DOB: 1946-11-22   Date:09/29/2016       Progress Note  Subjective  Chief Complaint  Chief Complaint  Patient presents with  . Follow-up    went to the [facility] with Edema, "improvement"- given IV lasix and prescription for 6 pills.     Depression         This is a new problem.  The current episode started more than 1 month ago.   The onset quality is sudden.   The problem occurs daily.  The problem has been waxing and waning since onset.  Associated symptoms include sad.  Associated symptoms include no helplessness, no hopelessness, does not have insomnia, not irritable, no restlessness, no decreased interest, no myalgias, no headaches and no suicidal ideas.     Exacerbated by: recent death of brother.  Past treatments include nothing.   Pertinent negatives include no physical disability, no terminal illness, no obsessive-compulsive disorder and no suicide attempts. Chest Pain   This is a new problem. The current episode started in the past 7 days. The onset quality is gradual. The problem occurs intermittently. The problem has been waxing and waning. The pain is present in the substernal region. The pain is moderate. The quality of the pain is described as tightness. The pain radiates to the left neck. Associated symptoms include exertional chest pressure, nausea, palpitations and shortness of breath. Pertinent negatives include no abdominal pain, back pain, cough, dizziness, fever, headaches, irregular heartbeat, malaise/fatigue or sputum production. The pain is aggravated by emotional upset. The treatment provided no relief.    No problem-specific Assessment & Plan notes found for this encounter.   Past Medical History:  Diagnosis Date  . Arthritis   . GERD (gastroesophageal reflux disease)   . Hypertension   . Obesity     Past Surgical History:  Procedure Laterality Date  . COLONOSCOPY  2012   cleared for 5 yrs  . MOUTH SURGERY       Family History  Problem Relation Age of Onset  . Cancer Mother   . Diabetes Mother   . Diabetes Father   . Heart disease Father   . Hypertension Father     Social History   Social History  . Marital status: Widowed    Spouse name: N/A  . Number of children: N/A  . Years of education: N/A   Occupational History  . Not on file.   Social History Main Topics  . Smoking status: Former Smoker    Quit date: 12/19/1966  . Smokeless tobacco: Not on file  . Alcohol use 0.0 oz/week  . Drug use: No  . Sexual activity: Not Currently   Other Topics Concern  . Not on file   Social History Narrative  . No narrative on file    Allergies  Allergen Reactions  . Codeine   . Erythromycin   . Penicillins      Review of Systems  Constitutional: Negative for chills, fever, malaise/fatigue and weight loss.  HENT: Negative for ear discharge, ear pain and sore throat.   Eyes: Negative for blurred vision.  Respiratory: Positive for shortness of breath. Negative for cough, sputum production and wheezing.   Cardiovascular: Positive for chest pain and palpitations. Negative for leg swelling.  Gastrointestinal: Positive for nausea. Negative for abdominal pain, blood in stool, constipation, diarrhea, heartburn and melena.  Genitourinary: Negative for dysuria, frequency, hematuria and urgency.  Musculoskeletal: Negative for back pain, joint  pain, myalgias and neck pain.  Skin: Negative for rash.  Neurological: Negative for dizziness, tingling, sensory change, focal weakness and headaches.  Endo/Heme/Allergies: Negative for environmental allergies and polydipsia. Does not bruise/bleed easily.  Psychiatric/Behavioral: Positive for depression. Negative for suicidal ideas. The patient is not nervous/anxious and does not have insomnia.      Objective  Vitals:   09/29/16 0900  BP: 138/64  Pulse: 72  Weight: (!) 329 lb (149.2 kg)  Height: 5\' 7"  (1.702 m)    Physical Exam   Constitutional: She is well-developed, well-nourished, and in no distress. She is not irritable. No distress.  HENT:  Head: Normocephalic and atraumatic.  Right Ear: External ear normal.  Left Ear: External ear normal.  Nose: Nose normal.  Mouth/Throat: Oropharynx is clear and moist.  Eyes: Conjunctivae and EOM are normal. Pupils are equal, round, and reactive to light. Right eye exhibits no discharge. Left eye exhibits no discharge.  Neck: Normal range of motion. Neck supple. No JVD present. No thyromegaly present.  Cardiovascular: Normal rate, regular rhythm, normal heart sounds and intact distal pulses.  Exam reveals no gallop and no friction rub.   No murmur heard. Pulmonary/Chest: Effort normal and breath sounds normal.  Abdominal: Soft. Bowel sounds are normal. She exhibits no mass. There is no tenderness. There is no guarding.  Musculoskeletal: Normal range of motion. She exhibits no edema.  Lymphadenopathy:    She has no cervical adenopathy.  Neurological: She is alert. She has normal reflexes.  Skin: Skin is warm and dry. She is not diaphoretic.  Psychiatric: Mood and affect normal.  Nursing note and vitals reviewed.     Assessment & Plan  Problem List Items Addressed This Visit      Other   Depression - Primary   Relevant Medications   sertraline (ZOLOFT) 50 MG tablet    Other Visit Diagnoses    Chest pain, unspecified type       Relevant Orders   Ambulatory referral to Cardiology        Dr. Elizabeth Sauereanna Lurene Robley [facility]  09/29/16

## 2016-10-05 DIAGNOSIS — I10 Essential (primary) hypertension: Secondary | ICD-10-CM | POA: Insufficient documentation

## 2016-10-05 DIAGNOSIS — R079 Chest pain, unspecified: Secondary | ICD-10-CM | POA: Insufficient documentation

## 2016-10-05 DIAGNOSIS — Z7689 Persons encountering health services in other specified circumstances: Secondary | ICD-10-CM | POA: Diagnosis not present

## 2016-10-05 DIAGNOSIS — I208 Other forms of angina pectoris: Secondary | ICD-10-CM | POA: Diagnosis not present

## 2016-10-05 DIAGNOSIS — R0602 Shortness of breath: Secondary | ICD-10-CM | POA: Diagnosis not present

## 2016-10-24 ENCOUNTER — Other Ambulatory Visit: Payer: Self-pay

## 2016-10-26 ENCOUNTER — Other Ambulatory Visit: Payer: Self-pay

## 2016-10-27 DIAGNOSIS — I208 Other forms of angina pectoris: Secondary | ICD-10-CM | POA: Diagnosis not present

## 2016-10-27 DIAGNOSIS — R0602 Shortness of breath: Secondary | ICD-10-CM | POA: Diagnosis not present

## 2016-11-17 DIAGNOSIS — Z6841 Body Mass Index (BMI) 40.0 and over, adult: Secondary | ICD-10-CM | POA: Diagnosis not present

## 2016-11-17 DIAGNOSIS — I10 Essential (primary) hypertension: Secondary | ICD-10-CM | POA: Diagnosis not present

## 2016-11-17 DIAGNOSIS — R0602 Shortness of breath: Secondary | ICD-10-CM | POA: Diagnosis not present

## 2016-11-24 ENCOUNTER — Encounter: Payer: Self-pay | Admitting: Family Medicine

## 2016-11-24 ENCOUNTER — Ambulatory Visit (INDEPENDENT_AMBULATORY_CARE_PROVIDER_SITE_OTHER): Payer: Medicare Other | Admitting: Family Medicine

## 2016-11-24 VITALS — BP 128/84 | HR 72 | Resp 16 | Ht 67.0 in | Wt 330.0 lb

## 2016-11-24 DIAGNOSIS — M170 Bilateral primary osteoarthritis of knee: Secondary | ICD-10-CM

## 2016-11-24 DIAGNOSIS — D72829 Elevated white blood cell count, unspecified: Secondary | ICD-10-CM

## 2016-11-24 DIAGNOSIS — Z23 Encounter for immunization: Secondary | ICD-10-CM | POA: Diagnosis not present

## 2016-11-24 DIAGNOSIS — I10 Essential (primary) hypertension: Secondary | ICD-10-CM | POA: Diagnosis not present

## 2016-11-24 DIAGNOSIS — R7303 Prediabetes: Secondary | ICD-10-CM | POA: Diagnosis not present

## 2016-11-24 DIAGNOSIS — Z6841 Body Mass Index (BMI) 40.0 and over, adult: Secondary | ICD-10-CM

## 2016-11-24 DIAGNOSIS — F329 Major depressive disorder, single episode, unspecified: Secondary | ICD-10-CM | POA: Diagnosis not present

## 2016-11-24 MED ORDER — SERTRALINE HCL 100 MG PO TABS: 100.0000 mg | ORAL_TABLET | Freq: Every day | ORAL | 3 refills | Status: DC

## 2016-11-24 MED ORDER — TIZANIDINE HCL 4 MG PO TABS: 2.0000 mg | ORAL_TABLET | Freq: Four times a day (QID) | ORAL | 0 refills | Status: DC | PRN

## 2016-11-24 NOTE — Progress Notes (Signed)
Date:  11/24/2016   Name:  Lydia Beard   DOB:  [birth year]   MRN:  [id number]  PCP:  Schuyler AmorWilliam Dock Baccam, MD    Chief Complaint: Hypertension   History of Present Illness:  This is a 70 y.o. female seen for six month f/u. Placed on Zoloft by Dr. Yetta BarreJones six weeks ago, feeling a little better, no se's noted. Brother died recently, taking hard. Saw cards for CP, w/u negative. Unable to exercise due to knee OA but unable to have surgery due to weight. Needs disability parking form done. WBC elevated, a1c improved in June.  Review of Systems:  Review of Systems  Constitutional: Negative for appetite change and fever.  Respiratory: Negative for cough and shortness of breath.   Cardiovascular: Negative for chest pain and leg swelling.  Gastrointestinal: Negative for abdominal pain.  Genitourinary: Negative for difficulty urinating.  Neurological: Negative for dizziness and syncope.    Patient Active Problem List   Diagnosis Date Noted  . Leukocytosis 11/24/2016  . Benign essential HTN 10/05/2016  . Depression 09/17/2015  . Prediabetes 09/17/2015  . OA (osteoarthritis) of knee 09/16/2015  . GERD (gastroesophageal reflux disease) 09/16/2015  . Body mass index (BMI) of 50-59.9 in adult [facility](HCC) 06/12/2014    Prior to Admission medications   Medication Sig Start Date End Date Taking? Authorizing Provider  acetaminophen (TYLENOL) 500 MG tablet Take 2 tablets (1,000 mg total) by mouth 2 (two) times daily. 09/16/15  Yes Schuyler AmorWilliam Satvik Parco, MD  fosinopril (MONOPRIL) 40 MG tablet TAKE 1 TABLET DAILY 09/10/16  Yes Schuyler AmorWilliam Tristain Daily, MD  hydrochlorothiazide (HYDRODIURIL) 25 MG tablet TAKE 1 TABLET DAILY 09/10/16  Yes Schuyler AmorWilliam Kenrick Pore, MD  Multiple Vitamin (MULTIVITAMIN) tablet Take 1 tablet by mouth daily.   Yes Historical Provider, MD  sertraline (ZOLOFT) 100 MG tablet Take 1 tablet (100 mg total) by mouth daily. 11/24/16  Yes Schuyler AmorWilliam Tatiyanna Lashley, MD  tiZANidine (ZANAFLEX) 4 MG tablet Take 0.5 tablets (2 mg total) by  mouth every 6 (six) hours as needed. Reported on 06/08/2016 11/24/16  Yes Schuyler AmorWilliam Dixie Coppa, MD    Allergies  Allergen Reactions  . Codeine   . Erythromycin   . Penicillins     Past Surgical History:  Procedure Laterality Date  . COLONOSCOPY  2012   cleared for 5 yrs  . MOUTH SURGERY      Social History  Substance Use Topics  . Smoking status: Former Smoker    Quit date: 12/19/1966  . Smokeless tobacco: Never Used  . Alcohol use 0.0 oz/week    Family History  Problem Relation Age of Onset  . Cancer Mother   . Diabetes Mother   . Diabetes Father   . Heart disease Father   . Hypertension Father     Medication list has been reviewed and updated.  Physical Examination: BP 128/84   Pulse 72   Resp 16   Ht 5\' 7"  (1.702 m)   Wt (!) 330 lb (149.7 kg)   SpO2 95%   BMI 51.69 kg/m   Physical Exam  Constitutional: She appears well-developed and well-nourished.  Cardiovascular: Normal rate, regular rhythm and normal heart sounds.   Pulmonary/Chest: Effort normal and breath sounds normal.  Musculoskeletal: She exhibits no edema.  Neurological: She is alert.  Skin: Skin is warm and dry.  Psychiatric: She has a normal mood and affect. Her behavior is normal.  Nursing note and vitals reviewed.   Assessment and Plan:  1. Reactive depression Marginal benefit from Zoloft, increase to  100 mg daily  2. Benign essential HTN Well controlled on Monopril/HCTZ  3. Primary osteoarthritis of both knees Pain improved on Tylenol bid, disabled parking form completed  4. Prediabetes - HgB A1c  5. Body mass index (BMI) of 50-59.9 in adult (HCC) Weight stable, labs unremarkable last year  6. Leukocytosis, unspecified type - CBC  7. Imms Prevnar given, for Pneumovax next visit, declines flu  Return in about 6 months (around 05/25/2017).  [name], Jr. MD [facility]  11/24/2016

## 2016-11-25 LAB — HEMOGLOBIN A1C
Est. average glucose Bld gHb Est-mCnc: 126 mg/dL
HEMOGLOBIN A1C: 6 % — AB (ref 4.8–5.6)

## 2016-11-25 LAB — CBC
HEMOGLOBIN: 14.6 g/dL (ref 11.1–15.9)
Hematocrit: 42.4 % (ref 34.0–46.6)
MCH: 30 pg (ref 26.6–33.0)
MCHC: 34.4 g/dL (ref 31.5–35.7)
MCV: 87 fL (ref 79–97)
Platelets: 283 10*3/uL (ref 150–379)
RBC: 4.86 x10E6/uL (ref 3.77–5.28)
RDW: 13.8 % (ref 12.3–15.4)
WBC: 11.2 10*3/uL — ABNORMAL HIGH (ref 3.4–10.8)

## 2017-01-18 ENCOUNTER — Other Ambulatory Visit: Payer: Self-pay | Admitting: Family Medicine

## 2017-01-18 MED ORDER — LISINOPRIL 40 MG PO TABS: 40.0000 mg | ORAL_TABLET | Freq: Every day | ORAL | 3 refills | Status: DC

## 2017-03-09 ENCOUNTER — Other Ambulatory Visit: Payer: Self-pay | Admitting: Family Medicine

## 2017-03-13 ENCOUNTER — Encounter: Payer: Self-pay | Admitting: Family Medicine

## 2017-03-13 ENCOUNTER — Ambulatory Visit (INDEPENDENT_AMBULATORY_CARE_PROVIDER_SITE_OTHER): Payer: Medicare Other | Admitting: Family Medicine

## 2017-03-13 VITALS — BP 131/82 | HR 81 | Temp 98.0°F | Resp 16 | Ht 67.0 in | Wt 336.9 lb

## 2017-03-13 DIAGNOSIS — H8110 Benign paroxysmal vertigo, unspecified ear: Secondary | ICD-10-CM | POA: Diagnosis not present

## 2017-03-13 DIAGNOSIS — I10 Essential (primary) hypertension: Secondary | ICD-10-CM

## 2017-03-13 DIAGNOSIS — R7303 Prediabetes: Secondary | ICD-10-CM | POA: Diagnosis not present

## 2017-03-13 DIAGNOSIS — R053 Chronic cough: Secondary | ICD-10-CM

## 2017-03-13 DIAGNOSIS — Z6841 Body Mass Index (BMI) 40.0 and over, adult: Secondary | ICD-10-CM | POA: Diagnosis not present

## 2017-03-13 DIAGNOSIS — F329 Major depressive disorder, single episode, unspecified: Secondary | ICD-10-CM

## 2017-03-13 DIAGNOSIS — R05 Cough: Secondary | ICD-10-CM | POA: Diagnosis not present

## 2017-03-13 DIAGNOSIS — H6502 Acute serous otitis media, left ear: Secondary | ICD-10-CM

## 2017-03-13 DIAGNOSIS — M170 Bilateral primary osteoarthritis of knee: Secondary | ICD-10-CM | POA: Diagnosis not present

## 2017-03-13 DIAGNOSIS — F32A Depression, unspecified: Secondary | ICD-10-CM

## 2017-03-13 NOTE — Patient Instructions (Signed)
Try Sudafed 12 hour one capsule twice daily for three days, call if symptoms worsen or persist.

## 2017-03-13 NOTE — Progress Notes (Signed)
Date:  03/13/2017   Name:  Lydia Beard   DOB:  1946-11-10   MRN:  [id number]  PCP:  Schuyler Amor, MD    Chief Complaint: Ear Pain (left ear full and twitches at least a week. Dizzy spell on Saturday.) and Cough (Cough x 3 weeks and some fatigue)   History of Present Illness:  This is a 71 y.o. female seen in three month f/u. C/o URI 3 wks ago, rhinorrhea initially, still with NP cough and intermittent L ear plugging. Also had episode of BPV last week resolved with meclizine. Told can't have knee surgery unless loses weight, asks about Xenical, has not seen dietician recently. Depression improved on increased dose Zoloft.  Review of Systems:  Review of Systems  Constitutional: Negative for appetite change, chills and fever.  HENT: Negative for ear discharge, sinus pressure and sore throat.   Respiratory: Negative for shortness of breath.   Cardiovascular: Negative for chest pain and leg swelling.  Endocrine: Negative for polydipsia and polyuria.  Neurological: Negative for syncope and light-headedness.    Patient Active Problem List   Diagnosis Date Noted  . Leukocytosis 11/24/2016  . Benign essential HTN 10/05/2016  . Depression 09/17/2015  . Prediabetes 09/17/2015  . OA (osteoarthritis) of knee 09/16/2015  . GERD (gastroesophageal reflux disease) 09/16/2015  . Body mass index (BMI) of 50-59.9 in adult [facility]) 06/12/2014    Prior to Admission medications   Medication Sig Start Date End Date Taking? Authorizing Provider  acetaminophen (TYLENOL) 500 MG tablet Take 2 tablets (1,000 mg total) by mouth 2 (two) times daily. 09/16/15  Yes Schuyler Amor, MD  hydrochlorothiazide (HYDRODIURIL) 25 MG tablet TAKE 1 TABLET DAILY 03/13/17  Yes Schuyler Amor, MD  lisinopril (PRINIVIL,ZESTRIL) 40 MG tablet Take 1 tablet (40 mg total) by mouth daily. 01/18/17  Yes Schuyler Amor, MD  Multiple Vitamin (MULTIVITAMIN) tablet Take 1 tablet by mouth daily.   Yes Historical Provider, MD  sertraline  (ZOLOFT) 100 MG tablet Take 1 tablet (100 mg total) by mouth daily. 11/24/16  Yes Schuyler Amor, MD  tiZANidine (ZANAFLEX) 4 MG tablet Take 0.5 tablets (2 mg total) by mouth every 6 (six) hours as needed. Reported on 06/08/2016 11/24/16  Yes Schuyler Amor, MD    Allergies  Allergen Reactions  . Codeine   . Erythromycin   . Penicillins     Past Surgical History:  Procedure Laterality Date  . COLONOSCOPY  2012   cleared for 5 yrs  . MOUTH SURGERY      Social History  Substance Use Topics  . Smoking status: Former Smoker    Quit date: 12/19/1966  . Smokeless tobacco: Never Used  . Alcohol use 0.0 oz/week    Family History  Problem Relation Age of Onset  . Cancer Mother   . Diabetes Mother   . Diabetes Father   . Heart disease Father   . Hypertension Father     Medication list has been reviewed and updated.  Physical Examination: BP 131/82   Pulse 81   Temp 98 F (36.7 C) (Oral)   Resp 16   Ht 5\' 7"  (1.702 m)   Wt (!) 336 lb 14.4 oz (152.8 kg)   SpO2 97%   BMI 52.77 kg/m   Physical Exam  Constitutional: She appears well-developed and well-nourished.  HENT:  Head: Normocephalic and atraumatic.  Right Ear: External ear normal.  Left Ear: External ear normal.  Nose: Nose normal.  Mouth/Throat: Oropharynx is clear and moist.  L  TM with fluid  Cardiovascular: Normal rate, regular rhythm and normal heart sounds.   Pulmonary/Chest: Effort normal and breath sounds normal.  Musculoskeletal: She exhibits no edema.  Neurological: She is alert.  Skin: Skin is warm and dry.  Psychiatric: She has a normal mood and affect. Her behavior is normal.  Nursing note and vitals reviewed.   Assessment and Plan:  1. Body mass index (BMI) of 50-59.9 in adult (HCC) Weight up 6#, refer MNT - Amb ref to Medical Nutrition Therapy-MNT  2. Acute serous otitis media of left ear, recurrence not specified Sudafed 12h bid x 3d, call if sxs worsen/persist  3. Cough,  persistent Improving, reassured often last sx to resolve  4. Benign paroxysmal vertigo, unspecified laterality Refill meclizine (current rx 71 yrs old), will call with dose  5. Benign essential HTN Well controlled on lisinopril/HCTZ  6. Depression, unspecified depression type Improved on increased dose Zoloft  7. Prediabetes Stable, a1c 6.0% in December  8. Primary osteoarthritis of both knees Cont Tylenol, planning surgery if can lose weight  Return in about 3 months (around 06/13/2017).  [name], Jr. MD [facility]  03/13/2017

## 2017-03-27 ENCOUNTER — Telehealth: Payer: Self-pay

## 2017-03-27 NOTE — Telephone Encounter (Signed)
Called asking if Retina damage can be something she see PCP for...ibuprofen advised her to see eye doctor.

## 2017-03-28 DIAGNOSIS — H43312 Vitreous membranes and strands, left eye: Secondary | ICD-10-CM | POA: Diagnosis not present

## 2017-04-07 ENCOUNTER — Other Ambulatory Visit: Payer: Self-pay | Admitting: Family Medicine

## 2017-04-07 ENCOUNTER — Telehealth: Payer: Self-pay

## 2017-04-07 MED ORDER — MECLIZINE HCL 25 MG PO TABS: 25.0000 mg | ORAL_TABLET | Freq: Two times a day (BID) | ORAL | 2 refills | Status: DC | PRN

## 2017-04-07 NOTE — Telephone Encounter (Signed)
Wants Rx for Meclazine refill called in. I do not see this on med list but I do see it mentioned in last OV note. [facility] if you send.

## 2017-04-07 NOTE — Telephone Encounter (Signed)
Meclizine 25 mg once daily prn [facility].

## 2017-04-07 NOTE — Telephone Encounter (Signed)
Sent!

## 2017-04-07 NOTE — Telephone Encounter (Signed)
She was supposed to call back with her usual meclizine dose as not in chart, I will refill once dose known.

## 2017-04-07 NOTE — Progress Notes (Signed)
Meclizine prn dizziness rx sent

## 2017-04-11 DIAGNOSIS — H25813 Combined forms of age-related cataract, bilateral: Secondary | ICD-10-CM | POA: Diagnosis not present

## 2017-05-25 ENCOUNTER — Encounter: Payer: Self-pay | Admitting: Family Medicine

## 2017-05-25 ENCOUNTER — Ambulatory Visit (INDEPENDENT_AMBULATORY_CARE_PROVIDER_SITE_OTHER): Payer: Medicare Other | Admitting: Family Medicine

## 2017-05-25 VITALS — BP 138/84 | HR 73 | Resp 16 | Ht 67.0 in | Wt 344.0 lb

## 2017-05-25 DIAGNOSIS — F32A Depression, unspecified: Secondary | ICD-10-CM

## 2017-05-25 DIAGNOSIS — Z6841 Body Mass Index (BMI) 40.0 and over, adult: Secondary | ICD-10-CM

## 2017-05-25 DIAGNOSIS — F329 Major depressive disorder, single episode, unspecified: Secondary | ICD-10-CM | POA: Diagnosis not present

## 2017-05-25 DIAGNOSIS — M170 Bilateral primary osteoarthritis of knee: Secondary | ICD-10-CM | POA: Diagnosis not present

## 2017-05-25 DIAGNOSIS — R7303 Prediabetes: Secondary | ICD-10-CM | POA: Diagnosis not present

## 2017-05-25 DIAGNOSIS — R600 Localized edema: Secondary | ICD-10-CM | POA: Diagnosis not present

## 2017-05-25 DIAGNOSIS — I10 Essential (primary) hypertension: Secondary | ICD-10-CM

## 2017-05-25 MED ORDER — FUROSEMIDE 20 MG PO TABS: 20.0000 mg | ORAL_TABLET | Freq: Every day | ORAL | 2 refills | Status: DC

## 2017-05-25 MED ORDER — TIZANIDINE HCL 4 MG PO TABS: 2.0000 mg | ORAL_TABLET | Freq: Four times a day (QID) | ORAL | 2 refills | Status: DC | PRN

## 2017-05-25 MED ORDER — ORLISTAT 120 MG PO CAPS: 120.0000 mg | ORAL_CAPSULE | Freq: Three times a day (TID) | ORAL | 2 refills | Status: DC

## 2017-05-25 NOTE — Progress Notes (Signed)
Date:  05/25/2017   Name:  Lydia Beard   DOB:  23-Feb-1946   MRN:  [id number]  PCP:  Lydia Amor, MD    Chief Complaint: Obesity; Rash (Gets rash and whelps on hands off and on. ); and Depression (Zoloft causing fatigue )   History of Present Illness:  This is a 71 y.o. female seen for three month f/u. Medicare would not cover MNT so never saw, has lost weight on Xenical in past without se's so would like to try again. Serous otitis resolved, cough almost gone. Thinks Zoloft contributing to dizziness, would like to stop. BLE edema worse, more painful, not improving overnight. Eye MD said vitreal detachment, no rx recommended. Had OA flare for few days last month, better now, no joint erythema.  Review of Systems:  Review of Systems  Constitutional: Negative for chills, fatigue and fever.  Respiratory: Negative for shortness of breath.   Cardiovascular: Negative for chest pain.  Gastrointestinal: Negative for abdominal pain.  Endocrine: Negative for polydipsia and polyuria.  Genitourinary: Negative for difficulty urinating.  Neurological: Negative for syncope and light-headedness.    Patient Active Problem List   Diagnosis Date Noted  . Leukocytosis 11/24/2016  . Benign essential HTN 10/05/2016  . Depression 09/17/2015  . Prediabetes 09/17/2015  . OA (osteoarthritis) of knee 09/16/2015  . GERD (gastroesophageal reflux disease) 09/16/2015  . Body mass index (BMI) of 50-59.9 in adult [facility]) 06/12/2014    Prior to Admission medications   Medication Sig Start Date End Date Taking? Authorizing Provider  acetaminophen (TYLENOL) 500 MG tablet Take 2 tablets (1,000 mg total) by mouth 2 (two) times daily. 09/16/15  Yes Abiha Lukehart, Chrissie Noa, MD  lisinopril (PRINIVIL,ZESTRIL) 40 MG tablet Take 1 tablet (40 mg total) by mouth daily. 01/18/17  Yes Jarissa Sheriff, Chrissie Noa, MD  meclizine (ANTIVERT) 25 MG tablet Take 1 tablet (25 mg total) by mouth 2 (two) times daily as needed for dizziness. 04/07/17  Yes  Cledith Kamiya, Chrissie Noa, MD  Multiple Vitamin (MULTIVITAMIN) tablet Take 1 tablet by mouth daily.   Yes [provider]  tiZANidine (ZANAFLEX) 4 MG tablet Take 0.5 tablets (2 mg total) by mouth every 6 (six) hours as needed. Reported on 06/08/2016 05/25/17  Yes Nikita Humble, Chrissie Noa, MD  furosemide (LASIX) 20 MG tablet Take 1 tablet (20 mg total) by mouth daily. 05/25/17   Aniaya Bacha, Chrissie Noa, MD  orlistat (XENICAL) 120 MG capsule Take 1 capsule (120 mg total) by mouth 3 (three) times daily with meals. 05/25/17   Lydia Amor, MD    Allergies  Allergen Reactions  . Codeine   . Erythromycin   . Penicillins     Past Surgical History:  Procedure Laterality Date  . COLONOSCOPY  2012   cleared for 5 yrs  . MOUTH SURGERY      Social History  Substance Use Topics  . Smoking status: Former Smoker    Quit date: 12/19/1966  . Smokeless tobacco: Never Used  . Alcohol use 0.0 oz/week    Family History  Problem Relation Age of Onset  . Cancer Mother   . Diabetes Mother   . Diabetes Father   . Heart disease Father   . Hypertension Father     Medication list has been reviewed and updated.  Physical Examination: BP 138/84   Pulse 73   Resp 16   Ht 5\' 7"  (1.702 m)   Wt (!) 344 lb (156 kg)   SpO2 96%   BMI 53.88 kg/m   Physical Exam  Constitutional:  She appears well-developed and well-nourished.  Cardiovascular: Normal rate, regular rhythm and normal heart sounds.   Pulmonary/Chest: Effort normal and breath sounds normal.  Musculoskeletal:  2+ BLE edema  Neurological: She is alert.  Skin: Skin is warm and dry.  Psychiatric: She has a normal mood and affect. Her behavior is normal.  Nursing note and vitals reviewed.   Assessment and Plan:  1. Benign essential HTN Well controlled on lisinopril/HCTZ  2. Primary osteoarthritis of both knees Cont Tylenol bid, Aleve ok for flares short term  3. Depression, unspecified depression type Taper off Zoloft, begin St. John's wort once off  4.  Lower extremity edema Progressive, change HCTZ to Lasix, check BMP next visit  5. Prediabetes Stable last Dec, recheck a1c next visit  6. Body mass index (BMI) of 50-59.9 in adult [facility](HCC) Restart Xenical, consider metformin given prediabetes  Return in about 4 weeks (around 06/22/2017).  [name], Jr. MD [facility]  05/25/2017

## 2017-05-25 NOTE — Patient Instructions (Signed)
Taper Zoloft one tablet every other day for 2 wks then one tablet every third day for two wks then stop.

## 2017-05-30 ENCOUNTER — Telehealth: Payer: Self-pay | Admitting: *Deleted

## 2017-05-30 NOTE — Telephone Encounter (Signed)
Pt called to let us know that insurance needs a PA for the drug Xenical. [facility] stated that copay would be the same for 90 days as for 30 so patient requested the 90 day supply. She states she has used this before with no side effects. I submitted a PA in the computer for a 90 day supply and am waiting on the response.

## 2017-05-30 NOTE — Telephone Encounter (Signed)
Thanks

## 2017-06-23 ENCOUNTER — Encounter: Payer: Self-pay | Admitting: Family Medicine

## 2017-06-23 ENCOUNTER — Ambulatory Visit (INDEPENDENT_AMBULATORY_CARE_PROVIDER_SITE_OTHER): Payer: Medicare Other | Admitting: Family Medicine

## 2017-06-23 VITALS — BP 126/84 | Resp 16 | Ht 67.0 in | Wt 357.0 lb

## 2017-06-23 DIAGNOSIS — M170 Bilateral primary osteoarthritis of knee: Secondary | ICD-10-CM

## 2017-06-23 DIAGNOSIS — F32A Depression, unspecified: Secondary | ICD-10-CM

## 2017-06-23 DIAGNOSIS — R600 Localized edema: Secondary | ICD-10-CM

## 2017-06-23 DIAGNOSIS — R7303 Prediabetes: Secondary | ICD-10-CM

## 2017-06-23 DIAGNOSIS — F329 Major depressive disorder, single episode, unspecified: Secondary | ICD-10-CM

## 2017-06-23 DIAGNOSIS — Z6841 Body Mass Index (BMI) 40.0 and over, adult: Secondary | ICD-10-CM | POA: Diagnosis not present

## 2017-06-23 DIAGNOSIS — I10 Essential (primary) hypertension: Secondary | ICD-10-CM | POA: Diagnosis not present

## 2017-06-23 MED ORDER — METFORMIN HCL 1000 MG PO TABS: 1000.0000 mg | ORAL_TABLET | Freq: Two times a day (BID) | ORAL | 2 refills | Status: DC

## 2017-06-23 NOTE — Patient Instructions (Signed)
DASH Eating Plan DASH stands for "Dietary Approaches to Stop Hypertension." The DASH eating plan is a healthy eating plan that has been shown to reduce high blood pressure (hypertension). It may also reduce your risk for type 2 diabetes, heart disease, and stroke. The DASH eating plan may also help with weight loss. What are tips for following this plan? General guidelines  Avoid eating more than 2,300 mg (milligrams) of salt (sodium) a day. If you have hypertension, you may need to reduce your sodium intake to 1,500 mg a day.  Limit alcohol intake to no more than 1 drink a day for nonpregnant women and 2 drinks a day for men. One drink equals 12 oz of beer, 5 oz of wine, or 1 oz of hard liquor.  Work with your health care provider to maintain a healthy body weight or to lose weight. Ask what an ideal weight is for you.  Get at least 30 minutes of exercise that causes your heart to beat faster (aerobic exercise) most days of the week. Activities may include walking, swimming, or biking.  Work with your health care provider or diet and nutrition specialist (dietitian) to adjust your eating plan to your individual calorie needs. Reading food labels  Check food labels for the amount of sodium per serving. Choose foods with less than 5 percent of the Daily Value of sodium. Generally, foods with less than 300 mg of sodium per serving fit into this eating plan.  To find whole grains, look for the word "whole" as the first word in the ingredient list. Shopping  Buy products labeled as "low-sodium" or "no salt added."  Buy fresh foods. Avoid canned foods and premade or frozen meals. Cooking  Avoid adding salt when cooking. Use salt-free seasonings or herbs instead of table salt or sea salt. Check with your health care provider or pharmacist before using salt substitutes.  Do not fry foods. Cook foods using healthy methods such as baking, boiling, grilling, and broiling instead.  Cook with  heart-healthy oils, such as olive, canola, soybean, or sunflower oil. Meal planning   Eat a balanced diet that includes: ? 5 or more servings of fruits and vegetables each day. At each meal, try to fill half of your plate with fruits and vegetables. ? Up to 6-8 servings of whole grains each day. ? Less than 6 oz of lean meat, poultry, or fish each day. A 3-oz serving of meat is about the same size as a deck of cards. One egg equals 1 oz. ? 2 servings of low-fat dairy each day. ? A serving of nuts, seeds, or beans 5 times each week. ? Heart-healthy fats. Healthy fats called Omega-3 fatty acids are found in foods such as flaxseeds and coldwater fish, like sardines, salmon, and mackerel.  Limit how much you eat of the following: ? Canned or prepackaged foods. ? Food that is high in trans fat, such as fried foods. ? Food that is high in saturated fat, such as fatty meat. ? Sweets, desserts, sugary drinks, and other foods with added sugar. ? Full-fat dairy products.  Do not salt foods before eating.  Try to eat at least 2 vegetarian meals each week.  Eat more home-cooked food and less restaurant, buffet, and fast food.  When eating at a restaurant, ask that your food be prepared with less salt or no salt, if possible. What foods are recommended? The items listed may not be a complete list. Talk with your dietitian about what   dietary choices are best for you. Grains Whole-grain or whole-wheat bread. Whole-grain or whole-wheat pasta. Brown rice. Oatmeal. Quinoa. Bulgur. Whole-grain and low-sodium cereals. Pita bread. Low-fat, low-sodium crackers. Whole-wheat flour tortillas. Vegetables Fresh or frozen vegetables (raw, steamed, roasted, or grilled). Low-sodium or reduced-sodium tomato and vegetable juice. Low-sodium or reduced-sodium tomato sauce and tomato paste. Low-sodium or reduced-sodium canned vegetables. Fruits All fresh, dried, or frozen fruit. Canned fruit in natural juice (without  added sugar). Meat and other protein foods Skinless chicken or turkey. Ground chicken or turkey. Pork with fat trimmed off. Fish and seafood. Egg whites. Dried beans, peas, or lentils. Unsalted nuts, nut butters, and seeds. Unsalted canned beans. Lean cuts of beef with fat trimmed off. Low-sodium, lean deli meat. Dairy Low-fat (1%) or fat-free (skim) milk. Fat-free, low-fat, or reduced-fat cheeses. Nonfat, low-sodium ricotta or cottage cheese. Low-fat or nonfat yogurt. Low-fat, low-sodium cheese. Fats and oils Soft margarine without trans fats. Vegetable oil. Low-fat, reduced-fat, or light mayonnaise and salad dressings (reduced-sodium). Canola, safflower, olive, soybean, and sunflower oils. Avocado. Seasoning and other foods Herbs. Spices. Seasoning mixes without salt. Unsalted popcorn and pretzels. Fat-free sweets. What foods are not recommended? The items listed may not be a complete list. Talk with your dietitian about what dietary choices are best for you. Grains Baked goods made with fat, such as croissants, muffins, or some breads. Dry pasta or rice meal packs. Vegetables Creamed or fried vegetables. Vegetables in a cheese sauce. Regular canned vegetables (not low-sodium or reduced-sodium). Regular canned tomato sauce and paste (not low-sodium or reduced-sodium). Regular tomato and vegetable juice (not low-sodium or reduced-sodium). Pickles. Olives. Fruits Canned fruit in a light or heavy syrup. Fried fruit. Fruit in cream or butter sauce. Meat and other protein foods Fatty cuts of meat. Ribs. Fried meat. Bacon. Sausage. Bologna and other processed lunch meats. Salami. Fatback. Hotdogs. Bratwurst. Salted nuts and seeds. Canned beans with added salt. Canned or smoked fish. Whole eggs or egg yolks. Chicken or turkey with skin. Dairy Whole or 2% milk, cream, and half-and-half. Whole or full-fat cream cheese. Whole-fat or sweetened yogurt. Full-fat cheese. Nondairy creamers. Whipped toppings.  Processed cheese and cheese spreads. Fats and oils Butter. Stick margarine. Lard. Shortening. Ghee. Bacon fat. Tropical oils, such as coconut, palm kernel, or palm oil. Seasoning and other foods Salted popcorn and pretzels. Onion salt, garlic salt, seasoned salt, table salt, and sea salt. Worcestershire sauce. Tartar sauce. Barbecue sauce. Teriyaki sauce. Soy sauce, including reduced-sodium. Steak sauce. Canned and packaged gravies. Fish sauce. Oyster sauce. Cocktail sauce. Horseradish that you find on the shelf. Ketchup. Mustard. Meat flavorings and tenderizers. Bouillon cubes. Hot sauce and Tabasco sauce. Premade or packaged marinades. Premade or packaged taco seasonings. Relishes. Regular salad dressings. Where to find more information:  National Heart, Lung, and Blood Institute: www.nhlbi.nih.gov  American Heart Association: www.heart.org Summary  The DASH eating plan is a healthy eating plan that has been shown to reduce high blood pressure (hypertension). It may also reduce your risk for type 2 diabetes, heart disease, and stroke.  With the DASH eating plan, you should limit salt (sodium) intake to 2,300 mg a day. If you have hypertension, you may need to reduce your sodium intake to 1,500 mg a day.  When on the DASH eating plan, aim to eat more fresh fruits and vegetables, whole grains, lean proteins, low-fat dairy, and heart-healthy fats.  Work with your health care provider or diet and nutrition specialist (dietitian) to adjust your eating plan to your individual   calorie needs. This information is not intended to replace advice given to you by your health care provider. Make sure you discuss any questions you have with your health care provider. Document Released: 11/24/2011 Document Revised: 11/28/2016 Document Reviewed: 11/28/2016 Elsevier Interactive Patient Education  2017 Elsevier Inc.  

## 2017-06-23 NOTE — Progress Notes (Signed)
Date:  06/23/2017   Name:  Lydia Beard   DOB:  15-May-1946   MRN:  [id number]  PCP:  Schuyler Amor, MD    Chief Complaint: Hypertension; Shortness of Breath (Daily x 2-3 months ); Leg Swelling; and Urticaria (itching all up arms. )   History of Present Illness:  This is a 71 y.o. female seen for one month f/u. BLE edema about the same on Lasix. Insurance denied Xenical, wants Contrave tried first. Almost done with Zoloft taper, feeling better off. OA knees about the same, taking Tylenol qd-bid. Some increased SOB with exertion.  Review of Systems:  Review of Systems  Constitutional: Negative for chills and fever.  Respiratory: Negative for cough.   Cardiovascular: Negative for chest pain.  Endocrine: Negative for polydipsia and polyuria.  Genitourinary: Negative for difficulty urinating.  Neurological: Negative for syncope and light-headedness.    Patient Active Problem List   Diagnosis Date Noted  . Lower extremity edema 05/25/2017  . Leukocytosis 11/24/2016  . Benign essential HTN 10/05/2016  . Depression 09/17/2015  . Prediabetes 09/17/2015  . OA (osteoarthritis) of knee 09/16/2015  . GERD (gastroesophageal reflux disease) 09/16/2015  . Body mass index (BMI) of 50-59.9 in adult [facility]) 06/12/2014    Prior to Admission medications   Medication Sig Start Date End Date Taking? Authorizing Provider  acetaminophen (TYLENOL) 500 MG tablet Take 2 tablets (1,000 mg total) by mouth 2 (two) times daily. 09/16/15  Yes Lalisa Kiehn, Chrissie Noa, MD  furosemide (LASIX) 20 MG tablet Take 1 tablet (20 mg total) by mouth daily. 05/25/17  Yes Meliah Appleman, Chrissie Noa, MD  lisinopril (PRINIVIL,ZESTRIL) 40 MG tablet Take 1 tablet (40 mg total) by mouth daily. 01/18/17  Yes Delcia Spitzley, Chrissie Noa, MD  meclizine (ANTIVERT) 25 MG tablet Take 1 tablet (25 mg total) by mouth 2 (two) times daily as needed for dizziness. 04/07/17  Yes Donley Harland, Chrissie Noa, MD  Multiple Vitamin (MULTIVITAMIN) tablet Take 1 tablet by mouth daily.   Yes  [provider]  tiZANidine (ZANAFLEX) 4 MG tablet Take 0.5 tablets (2 mg total) by mouth every 6 (six) hours as needed. Reported on 06/08/2016 05/25/17  Yes Luisana Lutzke, Chrissie Noa, MD  metFORMIN (GLUCOPHAGE) 1000 MG tablet Take 1 tablet (1,000 mg total) by mouth 2 (two) times daily with a meal. 06/23/17   Schuyler Amor, MD    Allergies  Allergen Reactions  . Codeine   . Erythromycin   . Penicillins     Past Surgical History:  Procedure Laterality Date  . COLONOSCOPY  2012   cleared for 5 yrs  . MOUTH SURGERY      Social History  Substance Use Topics  . Smoking status: Former Smoker    Quit date: 12/19/1966  . Smokeless tobacco: Never Used  . Alcohol use 0.0 oz/week    Family History  Problem Relation Age of Onset  . Cancer Mother   . Diabetes Mother   . Diabetes Father   . Heart disease Father   . Hypertension Father     Medication list has been reviewed and updated.  Physical Examination: BP 126/84   Resp 16   Ht 5\' 7"  (1.702 m)   Wt (!) 357 lb (161.9 kg)   SpO2 93%   BMI 55.91 kg/m   Physical Exam  Constitutional: She appears well-developed and well-nourished.  Cardiovascular: Normal rate, regular rhythm and normal heart sounds.   Pulmonary/Chest: Effort normal and breath sounds normal.  Musculoskeletal:  2+ BLE edema  Neurological: She is alert.  Skin: Skin  is warm and dry.  Psychiatric: She has a normal mood and affect. Her behavior is normal.  Nursing note and vitals reviewed.   Assessment and Plan:  1. Benign essential HTN Improved on lisinopril/Lasix - Basic Metabolic Panel (BMET)  2. Depression, unspecified depression type Stable on Zoloft taper  3. Prediabetes Begin metformin, may help with weight as well - HgB A1c - TSH  4. Body mass index (BMI) of 50-59.9 in adult (HCC) Weight up 13#, trial metformin, call with results one month, DASH diet given, consider Contrave if ineffective  5. Lower extremity edema Unchanged, consider increased  Lasix dose  6. Primary osteoarthritis of both knees Stable, cont regular Tylenol use  Return in about 3 months (around 09/23/2017).  [name], Jr. MD [facility]  06/23/2017

## 2017-06-24 LAB — HEMOGLOBIN A1C
Est. average glucose Bld gHb Est-mCnc: 123 mg/dL
Hgb A1c MFr Bld: 5.9 % — ABNORMAL HIGH (ref 4.8–5.6)

## 2017-06-24 LAB — BASIC METABOLIC PANEL
BUN / CREAT RATIO: 24 (ref 12–28)
BUN: 19 mg/dL (ref 8–27)
CHLORIDE: 101 mmol/L (ref 96–106)
CO2: 25 mmol/L (ref 20–29)
Calcium: 10.2 mg/dL (ref 8.7–10.3)
Creatinine, Ser: 0.8 mg/dL (ref 0.57–1.00)
GFR calc Af Amer: 87 mL/min/{1.73_m2} (ref >59)
GFR calc non Af Amer: 75 mL/min/{1.73_m2} (ref >59)
GLUCOSE: 91 mg/dL (ref 65–99)
Potassium: 4.6 mmol/L (ref 3.5–5.2)
SODIUM: 143 mmol/L (ref 134–144)

## 2017-06-24 LAB — TSH: TSH: 2.82 u[IU]/mL (ref 0.450–4.500)

## 2017-07-25 ENCOUNTER — Telehealth: Payer: Self-pay

## 2017-07-25 NOTE — Telephone Encounter (Signed)
30 day call report per patient: Doing ok but noticed no change in health after adding Metformin. Furosemide is helping some but still dealing with some swelling. Has F/U in October and will continue these meds until then unless we want to call and change anything.

## 2017-07-26 ENCOUNTER — Other Ambulatory Visit: Payer: Self-pay | Admitting: Family Medicine

## 2017-07-26 MED ORDER — FUROSEMIDE 40 MG PO TABS: 40.0000 mg | ORAL_TABLET | Freq: Every day | ORAL | 2 refills | Status: DC

## 2017-07-26 NOTE — Telephone Encounter (Signed)
She may increase her Lasix to 40 mg daily (use two 20 mg tablets until out, new rx sent).

## 2017-07-27 ENCOUNTER — Other Ambulatory Visit: Payer: Self-pay

## 2017-07-27 MED ORDER — FUROSEMIDE 40 MG PO TABS
40.0000 mg | ORAL_TABLET | Freq: Every day | ORAL | 3 refills | Status: DC
Start: 2017-07-27 — End: 2018-06-19

## 2017-09-28 ENCOUNTER — Ambulatory Visit (INDEPENDENT_AMBULATORY_CARE_PROVIDER_SITE_OTHER): Payer: Medicare Other | Admitting: Family Medicine

## 2017-09-28 ENCOUNTER — Encounter: Payer: Self-pay | Admitting: Family Medicine

## 2017-09-28 VITALS — BP 126/84 | HR 94 | Resp 16 | Ht 67.0 in | Wt 347.0 lb

## 2017-09-28 DIAGNOSIS — Z0000 Encounter for general adult medical examination without abnormal findings: Secondary | ICD-10-CM

## 2017-09-28 DIAGNOSIS — Z6841 Body Mass Index (BMI) 40.0 and over, adult: Secondary | ICD-10-CM

## 2017-09-28 DIAGNOSIS — R7303 Prediabetes: Secondary | ICD-10-CM | POA: Diagnosis not present

## 2017-09-28 DIAGNOSIS — R600 Localized edema: Secondary | ICD-10-CM | POA: Diagnosis not present

## 2017-09-28 DIAGNOSIS — F329 Major depressive disorder, single episode, unspecified: Secondary | ICD-10-CM | POA: Diagnosis not present

## 2017-09-28 DIAGNOSIS — R2681 Unsteadiness on feet: Secondary | ICD-10-CM | POA: Insufficient documentation

## 2017-09-28 DIAGNOSIS — K219 Gastro-esophageal reflux disease without esophagitis: Secondary | ICD-10-CM | POA: Diagnosis not present

## 2017-09-28 DIAGNOSIS — F32A Depression, unspecified: Secondary | ICD-10-CM

## 2017-09-28 DIAGNOSIS — M170 Bilateral primary osteoarthritis of knee: Secondary | ICD-10-CM | POA: Diagnosis not present

## 2017-09-28 DIAGNOSIS — I10 Essential (primary) hypertension: Secondary | ICD-10-CM | POA: Diagnosis not present

## 2017-09-28 NOTE — Progress Notes (Addendum)
Date:  09/28/2017   Name:  Lydia Beard   DOB:  04/02/1946   MRN:  [id number]  PCP:  Schuyler Amor, MD    Chief Complaint: Hypertension   History of Present Illness:  This is a 71 y.o. female seen for 3 month f/u. On increased Lasix, helping edema somewhat. Tapered off Zoloft, mood ok on St. John's wort. Started metformin for prediabetes/weight loss, feels eating less, weight down 10#. Gets GERD sxs 3-4x/wk, non-exertional, improve with antacids, taking unknown OTC med daily. Using Antivert and Zanaflex prn only.Wants to lose sufficient weight to have knees replaced, about 80#. Declines flu imm, mammogram.  Review of Systems:  Review of Systems  Constitutional: Negative for chills and fever.  Respiratory: Negative for cough and shortness of breath.   Cardiovascular: Negative for chest pain.  Gastrointestinal: Negative for constipation and diarrhea.  Endocrine: Negative for polydipsia and polyuria.  Genitourinary: Negative for difficulty urinating.  Neurological: Negative for syncope and light-headedness.    Patient Active Problem List   Diagnosis Date Noted  . Gait instability 09/28/2017  . Lower extremity edema 05/25/2017  . Leukocytosis 11/24/2016  . Benign essential HTN 10/05/2016  . Depression 09/17/2015  . Prediabetes 09/17/2015  . OA (osteoarthritis) of knee 09/16/2015  . GERD (gastroesophageal reflux disease) 09/16/2015  . Body mass index (BMI) of 50-59.9 in adult [facility]) 06/12/2014    Prior to Admission medications   Medication Sig Start Date End Date Taking? Authorizing Provider  acetaminophen (TYLENOL) 500 MG tablet Take 2 tablets (1,000 mg total) by mouth 2 (two) times daily. 09/16/15  Yes Rogers Ditter, Chrissie Noa, MD  furosemide (LASIX) 40 MG tablet Take 1 tablet (40 mg total) by mouth daily. 07/27/17  Yes Shin Lamour, Chrissie Noa, MD  lisinopril (PRINIVIL,ZESTRIL) 40 MG tablet Take 1 tablet (40 mg total) by mouth daily. 01/18/17  Yes Braelon Sprung, Chrissie Noa, MD  meclizine (ANTIVERT) 25 MG  tablet Take 1 tablet (25 mg total) by mouth 2 (two) times daily as needed for dizziness. 04/07/17  Yes Dequita Schleicher, Chrissie Noa, MD  metFORMIN (GLUCOPHAGE) 1000 MG tablet Take 1 tablet (1,000 mg total) by mouth 2 (two) times daily with a meal. 06/23/17  Yes Radiance Deady, MD  Multiple Vitamin (MULTIVITAMIN) tablet Take 1 tablet by mouth daily.   Yes [provider]  ST JOHNS WORT PO Take by mouth.   Yes [provider]  tiZANidine (ZANAFLEX) 4 MG tablet Take 0.5 tablets (2 mg total) by mouth every 6 (six) hours as needed. Reported on 06/08/2016 05/25/17  Yes Kalayah Leske, Chrissie Noa, MD    Allergies  Allergen Reactions  . Codeine   . Erythromycin   . Penicillins     Past Surgical History:  Procedure Laterality Date  . COLONOSCOPY  2012   cleared for 5 yrs  . MOUTH SURGERY      Social History  Substance Use Topics  . Smoking status: Former Smoker    Quit date: 12/19/1966  . Smokeless tobacco: Never Used  . Alcohol use 0.0 oz/week    Family History  Problem Relation Age of Onset  . Cancer Mother   . Diabetes Mother   . Diabetes Father   . Heart disease Father   . Hypertension Father     Medication list has been reviewed and updated.  Physical Examination: BP 126/84   Pulse 94   Resp 16   Ht  (1.702 m)   Wt (!) 347 lb (157.4 kg)   SpO2 96%   BMI 54.35 kg/m   Physical  Exam  Constitutional: She appears well-developed and well-nourished.  Cardiovascular: Normal rate, regular rhythm and normal heart sounds.   Pulmonary/Chest: Effort normal and breath sounds normal.  Musculoskeletal:  1+ BLE edema  Neurological: She is alert.  Gait unstable, uses cane  Skin: Skin is warm and dry.  Psychiatric: She has a normal mood and affect. Her behavior is normal.  Nursing note and vitals reviewed.   Assessment and Plan:  1. Benign essential HTN Well controlled on lisinopril/Lasix - Basic Metabolic Panel (BMET)  2. Gastroesophageal reflux disease, esophagitis presence not  specified Breakthrough sxs 3-4x/wk, will call with name OTC med  3. Primary osteoarthritis of both knees Plans TKR if can lose weight  4. Depression, unspecified depression type Stable on St. John's wort  5. Prediabetes On metformin - HgB A1c  6. Lower extremity edema Improved on increased Lasix  7. Gait instability On metformin - B12  8. Body mass index (BMI) of 50-59.9 in adult [facility]) Ok with 10# weight loss since last visit, consider Contrave if metformin ineffective  9. Healthcare maintenance - Hepatitis C Antibody  Return in about 3 months (around 12/29/2017).  Dionne Ano. Kingsley Spittle MD [facility]  09/28/2017

## 2017-09-29 LAB — HEPATITIS C ANTIBODY

## 2017-09-29 LAB — BASIC METABOLIC PANEL
BUN/Creatinine Ratio: 28 (ref 12–28)
BUN: 27 mg/dL (ref 8–27)
CALCIUM: 10.2 mg/dL (ref 8.7–10.3)
CHLORIDE: 100 mmol/L (ref 96–106)
CO2: 23 mmol/L (ref 20–29)
Creatinine, Ser: 0.98 mg/dL (ref 0.57–1.00)
GFR calc non Af Amer: 59 mL/min/{1.73_m2} — ABNORMAL LOW (ref >59)
GFR, EST AFRICAN AMERICAN: 68 mL/min/{1.73_m2} (ref >59)
GLUCOSE: 89 mg/dL (ref 65–99)
Potassium: 4.3 mmol/L (ref 3.5–5.2)
Sodium: 143 mmol/L (ref 134–144)

## 2017-09-29 LAB — HEMOGLOBIN A1C
ESTIMATED AVERAGE GLUCOSE: 128 mg/dL
HEMOGLOBIN A1C: 6.1 % — AB (ref 4.8–5.6)

## 2017-09-29 LAB — VITAMIN B12: VITAMIN B 12: 509 pg/mL (ref 232–1245)

## 2017-10-02 ENCOUNTER — Other Ambulatory Visit: Payer: Self-pay | Admitting: Family Medicine

## 2017-10-02 ENCOUNTER — Telehealth: Payer: Self-pay

## 2017-10-02 MED ORDER — OMEPRAZOLE 20 MG PO CPDR: 20.0000 mg | DELAYED_RELEASE_CAPSULE | Freq: Every day | ORAL | 3 refills | Status: DC

## 2017-10-02 MED ORDER — METFORMIN HCL 1000 MG PO TABS: 1000.0000 mg | ORAL_TABLET | Freq: Two times a day (BID) | ORAL | 3 refills | Status: DC

## 2017-10-02 NOTE — Telephone Encounter (Signed)
Patient called to report taking OTC:  Omeprazole 20 Extended release  Wants Metformin sent to [facility]

## 2017-10-02 NOTE — Telephone Encounter (Signed)
Med list updated and metformin rx sent

## 2017-12-26 ENCOUNTER — Other Ambulatory Visit: Payer: Self-pay | Admitting: Family Medicine

## 2018-01-03 ENCOUNTER — Ambulatory Visit (INDEPENDENT_AMBULATORY_CARE_PROVIDER_SITE_OTHER): Payer: Medicare Other | Admitting: Family Medicine

## 2018-01-03 ENCOUNTER — Encounter: Payer: Self-pay | Admitting: Family Medicine

## 2018-01-03 ENCOUNTER — Other Ambulatory Visit: Payer: Self-pay

## 2018-01-03 VITALS — BP 122/82 | HR 81 | Resp 16 | Ht 67.0 in | Wt 352.0 lb

## 2018-01-03 DIAGNOSIS — F329 Major depressive disorder, single episode, unspecified: Secondary | ICD-10-CM

## 2018-01-03 DIAGNOSIS — M170 Bilateral primary osteoarthritis of knee: Secondary | ICD-10-CM | POA: Diagnosis not present

## 2018-01-03 DIAGNOSIS — I10 Essential (primary) hypertension: Secondary | ICD-10-CM | POA: Diagnosis not present

## 2018-01-03 DIAGNOSIS — R7303 Prediabetes: Secondary | ICD-10-CM | POA: Diagnosis not present

## 2018-01-03 DIAGNOSIS — Z6841 Body Mass Index (BMI) 40.0 and over, adult: Secondary | ICD-10-CM | POA: Diagnosis not present

## 2018-01-03 DIAGNOSIS — R600 Localized edema: Secondary | ICD-10-CM | POA: Diagnosis not present

## 2018-01-03 DIAGNOSIS — F32A Depression, unspecified: Secondary | ICD-10-CM

## 2018-01-03 DIAGNOSIS — K219 Gastro-esophageal reflux disease without esophagitis: Secondary | ICD-10-CM

## 2018-01-04 NOTE — Progress Notes (Signed)
Date:  01/03/2018   Name:  Lydia Beard   DOB:  [birth year]   MRN:  [id number]  PCP:  Schuyler Amor, MD    Chief Complaint: Hypertension   History of Present Illness:  This is a 72 y.o. female seen for 3 month f/u. Taking OTC Prilosec daily for GERD, sxs recur when stops, has not tried H2B. Feels slightly more irritable lately, on St. John's wort. Weight up 5#, edema well controlled on Lasix, last a1c 6.1%, knee OA still bothers, told needs to lose weight for TKR.  Review of Systems:  Review of Systems  Constitutional: Negative for chills and fever.  Respiratory: Negative for cough and shortness of breath.   Cardiovascular: Negative for chest pain.  Gastrointestinal: Negative for abdominal pain.  Genitourinary: Negative for difficulty urinating.  Neurological: Negative for syncope and light-headedness.    Patient Active Problem List   Diagnosis Date Noted  . Gait instability 09/28/2017  . Lower extremity edema 05/25/2017  . Leukocytosis 11/24/2016  . Benign essential HTN 10/05/2016  . Depression 09/17/2015  . Prediabetes 09/17/2015  . OA (osteoarthritis) of knee 09/16/2015  . GERD (gastroesophageal reflux disease) 09/16/2015  . Body mass index (BMI) of 50-59.9 in adult [facility]) 06/12/2014    Prior to Admission medications   Medication Sig Start Date End Date Taking? Authorizing Provider  acetaminophen (TYLENOL) 500 MG tablet Take 2 tablets (1,000 mg total) by mouth 2 (two) times daily. 09/16/15  Yes Edgard Debord, Chrissie Noa, MD  furosemide (LASIX) 40 MG tablet Take 1 tablet (40 mg total) by mouth daily. 07/27/17  Yes Christoph Copelan, Chrissie Noa, MD  lisinopril (PRINIVIL,ZESTRIL) 40 MG tablet TAKE 1 TABLET DAILY 12/26/17  Yes Biruk Troia, Chrissie Noa, MD  metFORMIN (GLUCOPHAGE) 1000 MG tablet Take 1 tablet (1,000 mg total) by mouth 2 (two) times daily with a meal. 10/02/17  Yes Earnest Thalman, MD  Multiple Vitamin (MULTIVITAMIN) tablet Take 1 tablet by mouth daily.   Yes [provider]  ranitidine  (ZANTAC) 150 MG tablet Take 150 mg by mouth at bedtime.   Yes [provider]  ST JOHNS WORT PO Take by mouth.   Yes [provider]  tiZANidine (ZANAFLEX) 4 MG tablet Take 0.5 tablets (2 mg total) by mouth every 6 (six) hours as needed. Reported on 06/08/2016 05/25/17  Yes Panzy Bubeck, Chrissie Noa, MD    Allergies  Allergen Reactions  . Codeine Nausea And Vomiting  . Erythromycin Other (See Comments)  . Penicillins Swelling    Past Surgical History:  Procedure Laterality Date  . COLONOSCOPY  2012   cleared for 5 yrs  . MOUTH SURGERY      Social History   Tobacco Use  . Smoking status: Former Smoker    Last attempt to quit: 12/19/1966    Years since quitting: 51.0  . Smokeless tobacco: Never Used  Substance Use Topics  . Alcohol use: Yes    Alcohol/week: 0.0 oz  . Drug use: No    Family History  Problem Relation Age of Onset  . Cancer Mother   . Diabetes Mother   . Diabetes Father   . Heart disease Father   . Hypertension Father     Medication list has been reviewed and updated.  Physical Examination: BP 122/82   Pulse 81   Resp 16   Ht 5\' 7"  (1.702 m)   Wt (!) 352 lb (159.7 kg)   SpO2 94%   BMI 55.13 kg/m   Physical Exam  Constitutional: She appears well-developed and well-nourished.  Cardiovascular: Normal rate, regular rhythm and normal heart sounds.  Pulmonary/Chest: Effort normal and breath sounds normal.  Musculoskeletal: She exhibits no edema.  Neurological: She is alert.  Skin: Skin is warm and dry.  Psychiatric: She has a normal mood and affect. Her behavior is normal.  Nursing note and vitals reviewed.   Assessment and Plan:  1. Body mass index (BMI) of 50-59.9 in adult (HCC) 16 hour/d intermittent fasting discussed, consider Contrave if ineffective  2. Benign essential HTN Well controlled on lisinopril/Lasix  3. Gastroesophageal reflux disease, esophagitis presence not specified Try changing Prilosec to Zantac qhs  4. Primary  osteoarthritis of both knees Cont Tylenol, told needs to lose weight for TKR  5. Depression, unspecified depression type Increase dose St. John's wort, consider SSRI if unimproved (prefers to avoid)  6. Prediabetes Well controlled on metformin in Oct, recheck a1c next visit  7. Lower extremity edema Well controlled on Lasix  8. HM Due for Pneumovax/mammogram next visit  Return in about 3 months (around 04/03/2018).  [name], Jr. MD [facility]  01/04/2018

## 2018-04-04 ENCOUNTER — Encounter: Payer: Self-pay | Admitting: Family Medicine

## 2018-04-04 ENCOUNTER — Ambulatory Visit (INDEPENDENT_AMBULATORY_CARE_PROVIDER_SITE_OTHER): Payer: Medicare Other | Admitting: Family Medicine

## 2018-04-04 ENCOUNTER — Ambulatory Visit: Payer: Medicare Other | Admitting: Family Medicine

## 2018-04-04 VITALS — BP 122/82 | HR 90 | Resp 16 | Ht 67.0 in | Wt 352.0 lb

## 2018-04-04 DIAGNOSIS — M170 Bilateral primary osteoarthritis of knee: Secondary | ICD-10-CM | POA: Diagnosis not present

## 2018-04-04 DIAGNOSIS — R600 Localized edema: Secondary | ICD-10-CM

## 2018-04-04 DIAGNOSIS — F32A Depression, unspecified: Secondary | ICD-10-CM

## 2018-04-04 DIAGNOSIS — F329 Major depressive disorder, single episode, unspecified: Secondary | ICD-10-CM

## 2018-04-04 DIAGNOSIS — Z6841 Body Mass Index (BMI) 40.0 and over, adult: Secondary | ICD-10-CM

## 2018-04-04 DIAGNOSIS — I10 Essential (primary) hypertension: Secondary | ICD-10-CM | POA: Diagnosis not present

## 2018-04-04 DIAGNOSIS — K219 Gastro-esophageal reflux disease without esophagitis: Secondary | ICD-10-CM

## 2018-04-04 DIAGNOSIS — J302 Other seasonal allergic rhinitis: Secondary | ICD-10-CM | POA: Diagnosis not present

## 2018-04-04 DIAGNOSIS — R7303 Prediabetes: Secondary | ICD-10-CM

## 2018-04-04 DIAGNOSIS — Z23 Encounter for immunization: Secondary | ICD-10-CM

## 2018-04-04 DIAGNOSIS — J309 Allergic rhinitis, unspecified: Secondary | ICD-10-CM | POA: Insufficient documentation

## 2018-04-04 NOTE — Progress Notes (Addendum)
Date:  04/04/2018   Name:  Lydia Beard   DOB:  [birth year]   MRN:  [id number]  PCP:  Schuyler Amor, MD    Chief Complaint: Hypertension and Leg Swelling (tight and sore causing pain. )   History of Present Illness:  This is a 72 y.o. female seen for three month f/u. BLE edema worse today but generally stable. GERD adequately controlled on qhs Zantac. OA knees tolerable on Tylenol bid, depression stable on St. John's wort. Taking Benedryl prn allergies about twice a week.  Review of Systems:  Review of Systems  Constitutional: Negative for chills and fever.  Respiratory: Negative for cough and shortness of breath.   Cardiovascular: Negative for chest pain and leg swelling.  Genitourinary: Negative for difficulty urinating.  Neurological: Negative for syncope and light-headedness.    Patient Active Problem List   Diagnosis Date Noted  . Gait instability 09/28/2017  . Lower extremity edema 05/25/2017  . Leukocytosis 11/24/2016  . Benign essential HTN 10/05/2016  . Depression 09/17/2015  . Prediabetes 09/17/2015  . OA (osteoarthritis) of knee 09/16/2015  . GERD (gastroesophageal reflux disease) 09/16/2015  . Body mass index (BMI) of 50-59.9 in adult [facility]) 06/12/2014    Prior to Admission medications   Medication Sig Start Date End Date Taking? Authorizing Provider  acetaminophen (TYLENOL) 500 MG tablet Take 2 tablets (1,000 mg total) by mouth 2 (two) times daily. 09/16/15  Yes Diana Armijo, Chrissie Noa, MD  furosemide (LASIX) 40 MG tablet Take 1 tablet (40 mg total) by mouth daily. 07/27/17  Yes Tomiko Schoon, Chrissie Noa, MD  lisinopril (PRINIVIL,ZESTRIL) 40 MG tablet TAKE 1 TABLET DAILY 12/26/17  Yes Darrel Baroni, Chrissie Noa, MD  metFORMIN (GLUCOPHAGE) 1000 MG tablet Take 1 tablet (1,000 mg total) by mouth 2 (two) times daily with a meal. 10/02/17  Yes Aliza Moret, MD  Multiple Vitamin (MULTIVITAMIN) tablet Take 1 tablet by mouth daily.   Yes [provider]  ranitidine (ZANTAC) 150 MG tablet  Take 150 mg by mouth at bedtime.   Yes [provider]  ST JOHNS WORT PO Take by mouth.   Yes [provider]  tiZANidine (ZANAFLEX) 4 MG tablet Take 0.5 tablets (2 mg total) by mouth every 6 (six) hours as needed. Reported on 06/08/2016 05/25/17  Yes Gwendolyne Welford, Chrissie Noa, MD    Allergies  Allergen Reactions  . Codeine Nausea And Vomiting  . Erythromycin Other (See Comments)  . Penicillins Swelling    Past Surgical History:  Procedure Laterality Date  . COLONOSCOPY  2012   cleared for 5 yrs  . MOUTH SURGERY      Social History   Tobacco Use  . Smoking status: Former Smoker    Last attempt to quit: 12/19/1966    Years since quitting: 51.3  . Smokeless tobacco: Never Used  Substance Use Topics  . Alcohol use: Yes    Alcohol/week: 0.0 oz  . Drug use: No    Family History  Problem Relation Age of Onset  . Cancer Mother   . Diabetes Mother   . Diabetes Father   . Heart disease Father   . Hypertension Father     Medication list has been reviewed and updated.  Physical Examination: BP 122/82   Pulse 90   Resp 16   Ht 5\' 7"  (1.702 m)   Wt (!) 352 lb (159.7 kg)   SpO2 96%   BMI 55.13 kg/m   Physical Exam  Constitutional: She appears well-developed and well-nourished.  Cardiovascular: Normal rate, regular rhythm and  normal heart sounds.  Pulmonary/Chest: Effort normal and breath sounds normal.  Musculoskeletal:  2+ BLE edema  Neurological: She is alert.  Skin: Skin is warm and dry.  Psychiatric: She has a normal mood and affect. Her behavior is normal.  Nursing note and vitals reviewed.   Assessment and Plan:  1. Body mass index (BMI) of 50-59.9 in adult [facility](HCC) Options discussed, will continue diet, increase activity, exercise limited by OA/edema, consider Contrave as multiple medical morbidities related to obesity  2. Benign essential HTN Well controlled on lisinopril/Lasix - Comprehensive Metabolic Panel (CMET) - CBC  3. Gastroesophageal reflux  disease, esophagitis presence not specified Adequate control on Zantac qhs  4. Primary osteoarthritis of both knees Not surgical candidate given weight, cont Tylenol bid - Ambulatory referral to Physical Therapy  5. Depression, unspecified depression type Well controlled on St. John's wort  6. Seasonal allergic rhinitis, unspecified trigger Advised avoiding Benedryl, use Zyrtec or Claritin instead  7. Prediabetes Cont metformin - HgB A1c  8. Lower extremity edema Stable on current dose Lasix, monitor  9. Need for pneumococcal vaccination Pneumovax given  Return in about 6 months (around 10/04/2018).  [name], Jr. MD [facility]  04/04/2018

## 2018-04-05 LAB — COMPREHENSIVE METABOLIC PANEL
ALT: 21 IU/L (ref 0–32)
AST: 23 IU/L (ref 0–40)
Albumin/Globulin Ratio: 1.5 (ref 1.2–2.2)
Albumin: 4.4 g/dL (ref 3.5–4.8)
Alkaline Phosphatase: 80 IU/L (ref 39–117)
BUN/Creatinine Ratio: 23 (ref 12–28)
BUN: 19 mg/dL (ref 8–27)
Bilirubin Total: 0.4 mg/dL (ref 0.0–1.2)
CALCIUM: 10.4 mg/dL — AB (ref 8.7–10.3)
CO2: 24 mmol/L (ref 20–29)
CREATININE: 0.82 mg/dL (ref 0.57–1.00)
Chloride: 100 mmol/L (ref 96–106)
GFR, EST AFRICAN AMERICAN: 84 mL/min/{1.73_m2} (ref >59)
GFR, EST NON AFRICAN AMERICAN: 73 mL/min/{1.73_m2} (ref >59)
Globulin, Total: 2.9 g/dL (ref 1.5–4.5)
Glucose: 94 mg/dL (ref 65–99)
Potassium: 4.2 mmol/L (ref 3.5–5.2)
Sodium: 142 mmol/L (ref 134–144)
TOTAL PROTEIN: 7.3 g/dL (ref 6.0–8.5)

## 2018-04-05 LAB — CBC
HEMATOCRIT: 41.8 % (ref 34.0–46.6)
HEMOGLOBIN: 14.2 g/dL (ref 11.1–15.9)
MCH: 29.9 pg (ref 26.6–33.0)
MCHC: 34 g/dL (ref 31.5–35.7)
MCV: 88 fL (ref 79–97)
Platelets: 292 10*3/uL (ref 150–379)
RBC: 4.75 x10E6/uL (ref 3.77–5.28)
RDW: 14 % (ref 12.3–15.4)
WBC: 10.6 10*3/uL (ref 3.4–10.8)

## 2018-04-05 LAB — HEMOGLOBIN A1C
Est. average glucose Bld gHb Est-mCnc: 128 mg/dL
Hgb A1c MFr Bld: 6.1 % — ABNORMAL HIGH (ref 4.8–5.6)

## 2018-04-19 ENCOUNTER — Ambulatory Visit: Payer: Medicare Other | Admitting: Physical Therapy

## 2018-04-26 ENCOUNTER — Encounter: Payer: Self-pay | Admitting: Physical Therapy

## 2018-04-26 ENCOUNTER — Ambulatory Visit: Payer: Medicare Other | Attending: Family Medicine | Admitting: Physical Therapy

## 2018-04-26 DIAGNOSIS — M25661 Stiffness of right knee, not elsewhere classified: Secondary | ICD-10-CM | POA: Diagnosis not present

## 2018-04-26 DIAGNOSIS — M6281 Muscle weakness (generalized): Secondary | ICD-10-CM | POA: Diagnosis not present

## 2018-04-26 DIAGNOSIS — M25662 Stiffness of left knee, not elsewhere classified: Secondary | ICD-10-CM | POA: Diagnosis not present

## 2018-04-26 DIAGNOSIS — R269 Unspecified abnormalities of gait and mobility: Secondary | ICD-10-CM | POA: Diagnosis not present

## 2018-04-26 DIAGNOSIS — G8929 Other chronic pain: Secondary | ICD-10-CM | POA: Insufficient documentation

## 2018-04-26 DIAGNOSIS — M25561 Pain in right knee: Secondary | ICD-10-CM | POA: Insufficient documentation

## 2018-04-26 DIAGNOSIS — M25562 Pain in left knee: Secondary | ICD-10-CM | POA: Insufficient documentation

## 2018-04-28 NOTE — Therapy (Addendum)
[facility] [address] [place], [place], [address] Phone: [phone]   Fax:  [phone]  Physical Therapy Evaluation  Patient Details  Name: Lydia Beard MRN: [id number] Date of Birth: 01-04-1946 Referring Provider: Dr. Yetta Barre   Encounter Date: 04/26/2018  Treatment 1 of 8.  Recert date: 06/21/18   Past Medical History:  Diagnosis Date  . Arthritis   . GERD (gastroesophageal reflux disease)   . Hypertension   . Obesity     Past Surgical History:  Procedure Laterality Date  . COLONOSCOPY  2012   cleared for 5 yrs  . MOUTH SURGERY      There were no vitals filed for this visit.     Pt. reports chronic h/o B knee pain (>10 years). Pt. reports no falls but remains cautious with walking outside.       See HEP.  Nustep L4 10 minutes (155 steps).         Pt. is a pleasant 72 y/o female with chronic c/o B knee pain. Pt. reports 8/10 B knee pain at rest in chair and increase pain with prolonged standing/walking tasks. Pt. is hoping to lose weight to be a condidate for TKA. No falls reported. Pt. lives a sedentary lifestyle and will occasionally walk to mailbox. Pt. currently not participating with ex. program. Pt. will occasionlly use RW and power chair at [facility] due to knee pain. Decrease R/L knee AROM: extension (5/4 deg.), flexion (106/ 111 deg.). B quad strength grossly 4+/5 MMT (pain limited)/ hip flexion 4-/5 MMT. Pt. ambulates with limited knee flexion and has significant difficulty with ascending/ descending stairs. Pt. will benefit from skilled PT services to develop HEP and promote increase overall mobility to prepare for TKA.      PT Long Term Goals - 04/28/18 1823      PT LONG TERM GOAL #1   Title  Pt. independent with HEP to increase B hip/knee strength 1/2 muscle grade to improve standing tolerance/ ther.ex. participation.      Baseline  B quad strength grossly 4+/5 MMT (pain limited)/ hip flexion  4-/5 MMT.    Time  8    Period  Weeks    Status  New    Target Date  06/21/18      PT LONG TERM GOAL #2   Title  Pt. will report <5/10 B knee pain during all aspects of ther.ex.    Baseline  8/10 B knee pain at rest    Time  8    Period  Weeks    Status  New    Target Date  06/21/18      PT LONG TERM GOAL #3   Title  Pt. able to complete 30 minutes of ther.ex. with no increase c/o B knee pain to promote wt. loss/ progression to aquatic ex. program.      Baseline  poor endurance/ easily fatigued.      Time  8    Period  Weeks    Status  New    Target Date  06/21/18      PT LONG TERM GOAL #4   Title  Pt. able to ambulate for 10 minutes with consistent gait pattern/ no LOB safely to improve ability to shop/ go to [employer].      Baseline  using power w/c at [facility].  Limited walking distance to mailbox.      Time  8    Period  Weeks  Status  New    Target Date  06/21/18        Patient will benefit from skilled therapeutic intervention in order to improve the following deficits and impairments:  Abnormal gait, Pain, Improper body mechanics, Postural dysfunction, Decreased mobility, Decreased activity tolerance, Decreased endurance, Decreased range of motion, Decreased strength, Hypomobility, Obesity, Impaired flexibility, Difficulty walking, Increased edema, Decreased balance  Visit Diagnosis: Chronic pain of both knees  Gait difficulty  Muscle weakness (generalized)  Joint stiffness of both knees     Problem List Patient Active Problem List   Diagnosis Date Noted  . Allergic rhinitis 04/04/2018  . Gait instability 09/28/2017  . Lower extremity edema 05/25/2017  . Leukocytosis 11/24/2016  . Benign essential HTN 10/05/2016  . Depression 09/17/2015  . Prediabetes 09/17/2015  . OA (osteoarthritis) of knee 09/16/2015  . GERD (gastroesophageal reflux disease) 09/16/2015  . Body mass index (BMI) of 50-59.9 in adult [facility]) 06/12/2014   Cammie Mcgee, PT, DPT #  [phone] 06/19/2018, 6:28 PM  [facility] [address] [place], [place], [address] Phone: [phone]   Fax:  [phone]  Name: Lydia Beard MRN: [id number] Date of Birth: April 13, 1946

## 2018-05-08 ENCOUNTER — Ambulatory Visit: Payer: Medicare Other | Admitting: Physical Therapy

## 2018-05-08 DIAGNOSIS — G8929 Other chronic pain: Secondary | ICD-10-CM

## 2018-05-08 DIAGNOSIS — M25561 Pain in right knee: Secondary | ICD-10-CM | POA: Diagnosis not present

## 2018-05-08 DIAGNOSIS — M25661 Stiffness of right knee, not elsewhere classified: Secondary | ICD-10-CM | POA: Diagnosis not present

## 2018-05-08 DIAGNOSIS — R269 Unspecified abnormalities of gait and mobility: Secondary | ICD-10-CM | POA: Diagnosis not present

## 2018-05-08 DIAGNOSIS — M25562 Pain in left knee: Secondary | ICD-10-CM | POA: Diagnosis not present

## 2018-05-08 DIAGNOSIS — M6281 Muscle weakness (generalized): Secondary | ICD-10-CM

## 2018-05-08 DIAGNOSIS — M25662 Stiffness of left knee, not elsewhere classified: Secondary | ICD-10-CM

## 2018-05-12 NOTE — Therapy (Addendum)
[facility] [address] [place], [place], [address] Phone: [phone]   Fax:  [phone]  Physical Therapy Treatment  Patient Details  Name: Lydia Beard MRN: [id number] Date of Birth: [birth year] Referring Provider: Dr. Yetta Barre   Encounter Date: 05/08/2018  Treatment: 2 of 8.  Recert date:  05/27/61 1516 to 1610   Past Medical History:  Diagnosis Date  . Arthritis   . GERD (gastroesophageal reflux disease)   . Hypertension   . Obesity     Past Surgical History:  Procedure Laterality Date  . COLONOSCOPY  2012   cleared for 5 yrs  . MOUTH SURGERY      There were no vitals filed for this visit.     Pt. States she has not been active.  Pt. Reports no motivation to exercise due to knee pain.  PT educated pt. On importance of a consistent ex. Program for wt. Loss/ quad strengthening.  Pt. Reports 8/10 R anterior/posterior knee pain at this time.            There.ex.:  Nustep L 4 10 min. (110 steps)- pain limited/ fatigue Standing hip ex: flexion/ abd./ knee flexion/ extension 20x each in //-bars (cuing for lighter UE assist) Supine B LE stretches: hamstring/ heel slides with gentle over pressure/ gastroc/ trunk rotn. 5x each Supine bolster bridging/ SAQ (light manual resistance)/ ankle pumps 20x each  (rest breaks as needed) Walking in PT clinic/ lateral walking in //-bars  Reviewed HEP              Pt. Very pessimistic during initial aspects of PT tx. But seemed more open to importance of ex. With instruction/ education.  PT advised pt. To avoid pain-free movement patterns during knee flexion tasks.  Increase knee flexion after warm-up on Nustep.  No change to HEP but instructed to try and complete a least 1x/day.     PT Long Term Goals - 04/28/18 1823     PT LONG TERM GOAL #1   Title  Pt. independent with HEP to increase B hip/knee strength 1/2 muscle grade to improve standing tolerance/  ther.ex. participation.    Baseline  B quad strength grossly 4+/5 MMT (pain limited)/ hip flexion  4-/5 MMT.   Time  8    Period  Weeks    Status  New   Target Date  06/21/18     PT LONG TERM GOAL #2   Title  Pt. will report <5/10 B knee pain during all aspects of ther.ex.    Baseline  8/10 B knee pain at rest   Time  8    Period  Weeks    Status  New   Target Date  06/21/18     PT LONG TERM GOAL #3   Title  Pt. able to complete 30 minutes of ther.ex. with no increase c/o B knee pain to promote wt. loss/ progression to aquatic ex. program.      Baseline  poor endurance/ easily fatigued;     Time  8    Period  Weeks    Status  New   Target Date  06/21/18     PT LONG TERM GOAL #4   Title  Pt. able to ambulate for 10 minutes with consistent gait pattern/ no LOB safely to improve ability to shop/ go to [employer].      Baseline  using power w/c at [facility].  Limited walking distance to mailbox   Time  8    Period  Weeks    Status  New   Target Date  06/21/18             Patient will benefit from skilled therapeutic intervention in order to improve the following deficits and impairments:     Visit Diagnosis: Chronic pain of both knees  Gait difficulty  Muscle weakness (generalized)  Joint stiffness of both knees     Problem List Patient Active Problem List   Diagnosis Date Noted  . Allergic rhinitis 04/04/2018  . Gait instability 09/28/2017  . Lower extremity edema 05/25/2017  . Leukocytosis 11/24/2016  . Benign essential HTN 10/05/2016  . Depression 09/17/2015  . Prediabetes 09/17/2015  . OA (osteoarthritis) of knee 09/16/2015  . GERD (gastroesophageal reflux disease) 09/16/2015  . Body mass index (BMI) of 50-59.9 in adult [facility]) 06/12/2014      Cammie Mcgee, PT, DPT # [phone] 06/21/2018, 9:33 AM  [facility] [address] [place], [place], [address] Phone: [phone]   Fax:  [phone]  Name: Lydia Beard MRN: [id number] Date of Birth: 10-25-1946

## 2018-05-15 ENCOUNTER — Ambulatory Visit: Payer: Medicare Other | Admitting: Physical Therapy

## 2018-05-15 ENCOUNTER — Encounter: Payer: Self-pay | Admitting: Physical Therapy

## 2018-05-15 DIAGNOSIS — M25561 Pain in right knee: Principal | ICD-10-CM

## 2018-05-15 DIAGNOSIS — G8929 Other chronic pain: Secondary | ICD-10-CM

## 2018-05-15 DIAGNOSIS — M6281 Muscle weakness (generalized): Secondary | ICD-10-CM | POA: Diagnosis not present

## 2018-05-15 DIAGNOSIS — R269 Unspecified abnormalities of gait and mobility: Secondary | ICD-10-CM

## 2018-05-15 DIAGNOSIS — M25662 Stiffness of left knee, not elsewhere classified: Secondary | ICD-10-CM

## 2018-05-15 DIAGNOSIS — M25562 Pain in left knee: Principal | ICD-10-CM

## 2018-05-15 DIAGNOSIS — M25661 Stiffness of right knee, not elsewhere classified: Secondary | ICD-10-CM

## 2018-05-18 NOTE — Therapy (Addendum)
[facility] [address] [place], [place], [address] Phone: [phone]   Fax:  [phone]  Physical Therapy Treatment  Patient Details  Name: KHARTER SESTAK MRN: [id number] Date of Birth: July 12, 1946 Referring Provider: Dr. Yetta Barre   Encounter Date: 05/15/2018    Treatment: 3 of 8.  Recert date: 06/20/18 1431 to 1522   Past Medical History:  Diagnosis Date  . Arthritis   . GERD (gastroesophageal reflux disease)   . Hypertension   . Obesity     Past Surgical History:  Procedure Laterality Date  . COLONOSCOPY  2012   cleared for 5 yrs  . MOUTH SURGERY      There were no vitals filed for this visit.         Pt. Reports being sore after last tx.  Pt. States she is tired today.  Pt. C/o 8/10 knee pain.      There.ex.:  Nustep L5 B UE/LE (153 steps)  Sit to stands from blue mat table (varying heights)- SBA/CGA for safety Seated knee flexion with GTB 20x L/R //-bar: 3" plinth step overs Standing 3"/6" step touches in //-bars (light UE assist to no UE assist) Standing partial lunges at 1st step progressing to 2nd step Reviewed HEP       Pt. Requires moderate cuing/ motivation to complete strength training during PT tx. Session.  Pt. Needs cuing to focus on completing sets/reps.  Increase steps on Nustep today as compared to past tx. Sessions.           PT Long Term Goals - 04/28/18 1823           PT LONG TERM GOAL #1   Title  Pt. independent with HEP to increase B hip/knee strength 1/2 muscle grade to improve standing tolerance/ ther.ex. participation.    Baseline  B quad strength grossly 4+/5 MMT (pain limited)/ hip flexion  4-/5 MMT.   Time  8    Period  Weeks    Status  New   Target Date  06/21/18       PT LONG TERM GOAL #2   Title  Pt. will report <5/10 B knee pain during all aspects of ther.ex.    Baseline  8/10 B knee pain at rest   Time  8    Period  Weeks    Status   New   Target Date  06/21/18       PT LONG TERM GOAL #3   Title  Pt. able to complete 30 minutes of ther.ex. with no increase c/o B knee pain to promote wt. loss/ progression to aquatic ex. program.      Baseline  poor endurance/ easily fatigued;     Time  8    Period  Weeks    Status  New   Target Date  06/21/18       PT LONG TERM GOAL #4   Title  Pt. able to ambulate for 10 minutes with consistent gait pattern/ no LOB safely to improve ability to shop/ go to [employer].      Baseline  using power w/c at [facility].  Limited walking distance to mailbox   Time  8    Period  Weeks    Status  New   Target Date  06/21/18             Patient will benefit from skilled therapeutic intervention in order to improve the following deficits and impairments:  Visit Diagnosis: Chronic pain of both knees  Gait difficulty  Muscle weakness (generalized)  Joint stiffness of both knees     Problem List Patient Active Problem List   Diagnosis Date Noted  . Allergic rhinitis 04/04/2018  . Gait instability 09/28/2017  . Lower extremity edema 05/25/2017  . Leukocytosis 11/24/2016  . Benign essential HTN 10/05/2016  . Depression 09/17/2015  . Prediabetes 09/17/2015  . OA (osteoarthritis) of knee 09/16/2015  . GERD (gastroesophageal reflux disease) 09/16/2015  . Body mass index (BMI) of 50-59.9 in adult [facility]) 06/12/2014   Cammie Mcgee, PT, DPT # [phone] 06/20/2018, 10:23 AM  [facility] [address] [place], [place], [address] Phone: [phone]   Fax:  [phone]  Name: OLIA HINDERLITER MRN: [id number] Date of Birth: 1946-05-09

## 2018-05-22 ENCOUNTER — Encounter: Payer: Medicare Other | Admitting: Physical Therapy

## 2018-05-29 ENCOUNTER — Ambulatory Visit: Payer: Medicare Other | Attending: Family Medicine | Admitting: Physical Therapy

## 2018-05-29 DIAGNOSIS — M25562 Pain in left knee: Secondary | ICD-10-CM | POA: Insufficient documentation

## 2018-05-29 DIAGNOSIS — M25561 Pain in right knee: Secondary | ICD-10-CM | POA: Insufficient documentation

## 2018-05-29 DIAGNOSIS — M25661 Stiffness of right knee, not elsewhere classified: Secondary | ICD-10-CM

## 2018-05-29 DIAGNOSIS — R269 Unspecified abnormalities of gait and mobility: Secondary | ICD-10-CM

## 2018-05-29 DIAGNOSIS — G8929 Other chronic pain: Secondary | ICD-10-CM

## 2018-05-29 DIAGNOSIS — M6281 Muscle weakness (generalized): Secondary | ICD-10-CM | POA: Diagnosis not present

## 2018-05-29 DIAGNOSIS — M25662 Stiffness of left knee, not elsewhere classified: Secondary | ICD-10-CM | POA: Diagnosis not present

## 2018-06-01 NOTE — Therapy (Addendum)
[facility] [address] [place], [place], [address] Phone: [phone]   Fax:  [phone]  Physical Therapy Treatment  Patient Details  Name: Lydia Beard MRN: [id number] Date of Birth: 10-19-1946 Referring Provider: Dr. Yetta BarreJones   Encounter Date: 05/29/2018   Treatment: 4 of 8.  Recert date: 06/20/18 1436 to 1530   Past Medical History:  Diagnosis Date  . Arthritis   . GERD (gastroesophageal reflux disease)   . Hypertension   . Obesity     Past Surgical History:  Procedure Laterality Date  . COLONOSCOPY  2012   cleared for 5 yrs  . MOUTH SURGERY      There were no vitals filed for this visit.     Pt. Reports cancelling last week due to not feeling up to therapy.  Pt. Feeling better today but reports 8/10 B knee pain.  R knee pain is worse than L.        There.ex.:  Nustep L 4-5 B UE/LE (267 steps) Resisted gait in //-bars all 4-planes of movement with moderate cuing to correct upright posture/ increase step pattern.   Pt. Does not like looking at mirror.   STS from chair with added lunges on L/R 10x each.  Several rest breaks Supine SLR/ seated knee flexion/ LAQ/ heel raises 20x each. Reviewed HEP            Pt. Worked well during PT tx. Session and showed improvement with tx. Tolerance/ steps on Nustep.  B knee pain t/o tx. Session with no worsening symptoms.  Pt. Able to complete STS with lunges in a pain tolerance range but fatigues quickly.  PT discussed the benefits of aquatic ex. But pt. Not interested at this time.     PT Long Term Goals - 05/11/191823           PT LONG TERM GOAL #1   Title Pt. independent with HEP to increase B hip/knee strength 1/2 muscle grade to improve standing tolerance/ ther.ex. participation.    Baseline B quad strength grossly 4+/5 MMT (pain limited)/ hip flexion 4-/5 MMT.   Time 8    Period Weeks    Status New   Target Date 06/21/18        PT LONG TERM GOAL #2   Title Pt. will report <5/10 B knee pain during all aspects of ther.ex.    Baseline 8/10 B knee pain at rest   Time 8    Period Weeks    Status New   Target Date 06/21/18       PT LONG TERM GOAL #3   Title Pt. able to complete 30 minutes of ther.ex. with no increase c/o B knee pain to promote wt. loss/ progression to aquatic ex. program.    Baseline poor endurance/ easily fatigued;    Time 8    Period Weeks    Status New   Target Date 06/21/18       PT LONG TERM GOAL #4   Title Pt. able to ambulate for 10 minutes with consistent gait pattern/ no LOB safely to improve ability to shop/ go to [employer].    [facility] Limited walking distance to mailbox   Time 8    Period Weeks    Status New   Target Date 06/21/18           Patient will benefit from skilled therapeutic intervention in order to improve the following deficits and impairments:  Visit Diagnosis: Chronic pain of both knees  Gait difficulty  Muscle weakness (generalized)  Joint stiffness of both knees     Problem List Patient Active Problem List   Diagnosis Date Noted  . Allergic rhinitis 04/04/2018  . Gait instability 09/28/2017  . Lower extremity edema 05/25/2017  . Leukocytosis 11/24/2016  . Benign essential HTN 10/05/2016  . Depression 09/17/2015  . Prediabetes 09/17/2015  . OA (osteoarthritis) of knee 09/16/2015  . GERD (gastroesophageal reflux disease) 09/16/2015  . Body mass index (BMI) of 50-59.9 in adult [facility]) 06/12/2014   Cammie Mcgee, PT, DPT # [phone] 06/01/2018, 5:16 PM  [facility] [address] [place], [place], [address] Phone: [phone]   Fax:  [phone]  Name: Lydia Beard MRN: [id number] Date of Birth: 1946-06-26

## 2018-06-04 ENCOUNTER — Other Ambulatory Visit: Payer: Self-pay

## 2018-06-05 ENCOUNTER — Encounter: Payer: Self-pay | Admitting: Physical Therapy

## 2018-06-05 ENCOUNTER — Ambulatory Visit: Payer: Medicare Other | Admitting: Physical Therapy

## 2018-06-05 DIAGNOSIS — M25661 Stiffness of right knee, not elsewhere classified: Secondary | ICD-10-CM

## 2018-06-05 DIAGNOSIS — M25562 Pain in left knee: Secondary | ICD-10-CM | POA: Diagnosis not present

## 2018-06-05 DIAGNOSIS — G8929 Other chronic pain: Secondary | ICD-10-CM | POA: Diagnosis not present

## 2018-06-05 DIAGNOSIS — R269 Unspecified abnormalities of gait and mobility: Secondary | ICD-10-CM

## 2018-06-05 DIAGNOSIS — M6281 Muscle weakness (generalized): Secondary | ICD-10-CM | POA: Diagnosis not present

## 2018-06-05 DIAGNOSIS — M25561 Pain in right knee: Secondary | ICD-10-CM | POA: Diagnosis not present

## 2018-06-05 DIAGNOSIS — M25662 Stiffness of left knee, not elsewhere classified: Secondary | ICD-10-CM

## 2018-06-06 NOTE — Therapy (Addendum)
[facility] [address] [place], [place], [address] Phone: [phone]   Fax:  [phone]  Physical Therapy Treatment  Patient Details  Name: LAQUETTA RACEY MRN: [id number] Date of Birth: 20-Aug-1946 Referring Provider: Dr. Yetta Barre   Encounter Date: 06/05/2018    Treatment: 5 of 8.  Recert date: 06/20/18 1433 to 1524   Past Medical History:  Diagnosis Date  . Arthritis   . GERD (gastroesophageal reflux disease)   . Hypertension   . Obesity     Past Surgical History:  Procedure Laterality Date  . COLONOSCOPY  2012   cleared for 5 yrs  . MOUTH SURGERY      There were no vitals filed for this visit.     Pt. States she is doing okay today and entered PT with a more upbeat attitude.  Pt. States she is ready for Nustep.  Pt. Took Aleve prior to tx. Session.          There.ex.:  Nustep L5 B UE/LE (251 steps)- warm-up  (12 min.) Resisted gait in //-bars all 4-planes of movement with moderate cuing to correct upright posture/ increase step pattern.   Pt. Does not like looking at mirror.   Step ups/down on 3" and 6" steps in //-bars with B UE assist (pain limited) Seated/ standing 4# ex. Program (19 min.) Seated GTB knee flexion on blue mat table 20x            Pt. Completed FOTO today: initial eval 30/ today 38/ goal 48.  Pt. Did well during tx. Session today and has shown good progress over past several weeks.  PT discussed importance of wt. Loss to prepare for possible TKA.  Pt. Understands importance but carryover has been limited with HEP/ increasing daily activity.         PT Long Term Goals - 05/11/191823           PT LONG TERM GOAL #1   Title Pt. independent with HEP to increase B hip/knee strength 1/2 muscle grade to improve standing tolerance/ ther.ex. participation.    Baseline B quad strength grossly 4+/5 MMT (pain limited)/ hip flexion 4-/5 MMT.   Time 8    Period Weeks     Status New   Target Date 06/21/18       PT LONG TERM GOAL #2   Title Pt. will report <5/10 B knee pain during all aspects of ther.ex.    Baseline 8/10 B knee pain at rest   Time 8    Period Weeks    Status New   Target Date 06/21/18       PT LONG TERM GOAL #3   Title Pt. able to complete 30 minutes of ther.ex. with no increase c/o B knee pain to promote wt. loss/ progression to aquatic ex. program.    Baseline poor endurance/ easily fatigued;    Time 8    Period Weeks    Status New   Target Date 06/21/18       PT LONG TERM GOAL #4   Title Pt. able to ambulate for 10 minutes with consistent gait pattern/ no LOB safely to improve ability to shop/ go to [employer].    Baseline using power w/c at [facility]. Limited walking distance to mailbox   Time 8    Period Weeks    Status New   Target Date 06/21/18          Patient will benefit from skilled therapeutic  intervention in order to improve the following deficits and impairments:     Visit Diagnosis: Chronic pain of both knees  Gait difficulty  Muscle weakness (generalized)  Joint stiffness of both knees     Problem List Patient Active Problem List   Diagnosis Date Noted  . Allergic rhinitis 04/04/2018  . Gait instability 09/28/2017  . Lower extremity edema 05/25/2017  . Leukocytosis 11/24/2016  . Benign essential HTN 10/05/2016  . Depression 09/17/2015  . Prediabetes 09/17/2015  . OA (osteoarthritis) of knee 09/16/2015  . GERD (gastroesophageal reflux disease) 09/16/2015  . Body mass index (BMI) of 50-59.9 in adult [facility] 06/12/2014   Cammie McgeeMichael C Marielys Trinidad, PT, DPT # [phone] 06/06/2018, 5:53 PM  [facility] [address] [place], [place], [address] Phone: [phone]   Fax:  [phone]  Name: Marge Duncanslona G Poarch MRN: [id number] Date of Birth: 1946/02/25

## 2018-06-11 ENCOUNTER — Other Ambulatory Visit: Payer: Self-pay

## 2018-06-12 ENCOUNTER — Encounter: Payer: Medicare Other | Admitting: Physical Therapy

## 2018-06-19 ENCOUNTER — Ambulatory Visit (INDEPENDENT_AMBULATORY_CARE_PROVIDER_SITE_OTHER): Payer: Medicare Other | Admitting: Family Medicine

## 2018-06-19 ENCOUNTER — Ambulatory Visit: Payer: Medicare Other | Attending: Family Medicine | Admitting: Physical Therapy

## 2018-06-19 ENCOUNTER — Encounter: Payer: Self-pay | Admitting: Family Medicine

## 2018-06-19 VITALS — BP 130/70 | HR 88 | Ht 67.0 in | Wt 338.0 lb

## 2018-06-19 DIAGNOSIS — R7303 Prediabetes: Secondary | ICD-10-CM | POA: Diagnosis not present

## 2018-06-19 DIAGNOSIS — M799 Soft tissue disorder, unspecified: Secondary | ICD-10-CM | POA: Diagnosis not present

## 2018-06-19 DIAGNOSIS — I10 Essential (primary) hypertension: Secondary | ICD-10-CM

## 2018-06-19 DIAGNOSIS — M25562 Pain in left knee: Secondary | ICD-10-CM | POA: Diagnosis not present

## 2018-06-19 DIAGNOSIS — M25662 Stiffness of left knee, not elsewhere classified: Secondary | ICD-10-CM | POA: Diagnosis not present

## 2018-06-19 DIAGNOSIS — R269 Unspecified abnormalities of gait and mobility: Secondary | ICD-10-CM

## 2018-06-19 DIAGNOSIS — K219 Gastro-esophageal reflux disease without esophagitis: Secondary | ICD-10-CM

## 2018-06-19 DIAGNOSIS — M6281 Muscle weakness (generalized): Secondary | ICD-10-CM | POA: Diagnosis not present

## 2018-06-19 DIAGNOSIS — G8929 Other chronic pain: Secondary | ICD-10-CM | POA: Diagnosis not present

## 2018-06-19 DIAGNOSIS — M25661 Stiffness of right knee, not elsewhere classified: Secondary | ICD-10-CM | POA: Diagnosis not present

## 2018-06-19 DIAGNOSIS — R600 Localized edema: Secondary | ICD-10-CM | POA: Diagnosis not present

## 2018-06-19 DIAGNOSIS — M25561 Pain in right knee: Secondary | ICD-10-CM | POA: Diagnosis not present

## 2018-06-19 MED ORDER — LISINOPRIL 40 MG PO TABS: 40.0000 mg | ORAL_TABLET | Freq: Every day | ORAL | 3 refills | Status: DC

## 2018-06-19 MED ORDER — METFORMIN HCL 1000 MG PO TABS: 1000.0000 mg | ORAL_TABLET | Freq: Two times a day (BID) | ORAL | 3 refills | Status: DC

## 2018-06-19 MED ORDER — RANITIDINE HCL 150 MG PO TABS: 150.0000 mg | ORAL_TABLET | Freq: Every day | ORAL | 1 refills | Status: DC

## 2018-06-19 MED ORDER — FUROSEMIDE 40 MG PO TABS: 40.0000 mg | ORAL_TABLET | Freq: Every day | ORAL | 3 refills | Status: DC

## 2018-06-19 MED ORDER — TIZANIDINE HCL 4 MG PO TABS: 2.0000 mg | ORAL_TABLET | Freq: Every day | ORAL | 3 refills | Status: DC

## 2018-06-19 NOTE — Assessment & Plan Note (Signed)
Patient accepts control furosemide 40 mg daily

## 2018-06-19 NOTE — Assessment & Plan Note (Signed)
Stable Controlled on ranitidine 150 mg

## 2018-06-19 NOTE — Assessment & Plan Note (Signed)
Controlled. Cont furosemide 40 mg and lisinopril 40 mg. Labs in 5 months

## 2018-06-19 NOTE — Assessment & Plan Note (Signed)
Last A1c 6.1. Continue metformen 1 gm bid

## 2018-06-19 NOTE — Progress Notes (Signed)
Name: Lydia Beard   MRN: [id number]    DOB: 09-17-1946   Date:06/19/2018       Progress Note  Subjective  Chief Complaint  Chief Complaint  Patient presents with  . Edema    lower extremities- refill furosemide  . Arthritis    refill tizanidine  . Hypertension  . prediabetes    takes metformin  . Dizziness    needs refill on Meclizine    Arthritis  Presents for follow-up visit. She complains of pain and stiffness. She reports no joint swelling. Symptom course: unchanging. Affected locations include the left knee and right knee. Pertinent negatives include no diarrhea, dry eyes, dry mouth, dysuria, fatigue, fever, rash or weight loss. Side effects of treatment include headaches.  Hypertension  This is a chronic problem. The current episode started more than 1 year ago. The problem has been waxing and waning since onset. The problem is controlled. Pertinent negatives include no anxiety, blurred vision, chest pain, headaches, malaise/fatigue, neck pain, orthopnea, palpitations, peripheral edema, PND, shortness of breath or sweats. There are no associated agents to hypertension. Risk factors for coronary artery disease include dyslipidemia. Past treatments include ACE inhibitors. The current treatment provides moderate improvement. There are no compliance problems.  There is no history of angina, kidney disease, CAD/MI, CVA, heart failure, left ventricular hypertrophy, PVD or retinopathy. There is no history of chronic renal disease, a hypertension causing med or renovascular disease.  Dizziness  This is a recurrent problem. The current episode started more than 1 year ago. The problem occurs intermittently. Associated symptoms include coughing and vertigo. Pertinent negatives include no abdominal pain, anorexia, arthralgias, chest pain, chills, congestion, diaphoresis, fatigue, fever, headaches, joint swelling, myalgias, nausea, neck pain, numbness, rash, sore throat, swollen glands,  urinary symptoms or weakness. The treatment provided mild relief.  Diabetes  Diabetes visit type: prediabetes. Her disease course has been stable. Hypoglycemia symptoms include dizziness. Pertinent negatives for hypoglycemia include no confusion, headaches, hunger, nervousness/anxiousness, pallor, speech difficulty, sweats or tremors. Pertinent negatives for diabetes include no blurred vision, no chest pain, no fatigue, no foot paresthesias, no foot ulcerations, no polydipsia, no polyphagia, no polyuria, no weakness and no weight loss. There are no hypoglycemic complications. Symptoms are stable. Pertinent negatives for diabetic complications include no autonomic neuropathy, CVA, heart disease, impotence, nephropathy, peripheral neuropathy, PVD or retinopathy. Current diabetic treatment includes oral agent (monotherapy). She is following a generally healthy diet. Meal planning includes avoidance of concentrated sweets and carbohydrate counting. An ACE inhibitor/angiotensin II receptor blocker is being taken.  Gastroesophageal Reflux  She complains of coughing. She reports no abdominal pain, no belching, no chest pain, no choking, no dysphagia, no early satiety, no globus sensation, no heartburn, no hoarse voice, no nausea, no sore throat, no stridor, no tooth decay, no water brash or no wheezing. This is a recurrent problem. The problem has been gradually improving. Pertinent negatives include no fatigue, melena or weight loss.    Benign essential HTN Controlled. Cont furosemide 40 mg and lisinopril 40 mg. Labs in 5 months  GERD (gastroesophageal reflux disease) Stable Controlled on ranitidine 150 mg  Prediabetes Last A1c 6.1. Continue metformen 1 gm bid  Lower extremity edema Patient accepts control furosemide 40 mg daily   Past Medical History:  Diagnosis Date  . Arthritis   . GERD (gastroesophageal reflux disease)   . Hypertension   . Obesity     Past Surgical History:  Procedure  Laterality Date  . COLONOSCOPY  2012  cleared for 5 yrs  . MOUTH SURGERY      Family History  Problem Relation Age of Onset  . Cancer Mother   . Diabetes Mother   . Diabetes Father   . Heart disease Father   . Hypertension Father     Social History   Socioeconomic History  . Marital status: Widowed    Spouse name: Not on file  . Number of children: Not on file  . Years of education: Not on file  . Highest education level: Not on file  Occupational History  . Not on file  Social Needs  . Financial resource strain: Not on file  . Food insecurity:    Worry: Not on file    Inability: Not on file  . Transportation needs:    Medical: Not on file    Non-medical: Not on file  Tobacco Use  . Smoking status: Former Smoker    Last attempt to quit: 12/19/1966    Years since quitting: 51.5  . Smokeless tobacco: Never Used  Substance and Sexual Activity  . Alcohol use: Yes    Alcohol/week: 0.0 oz  . Drug use: No  . Sexual activity: Not Currently  Lifestyle  . Physical activity:    Days per week: Not on file    Minutes per session: Not on file  . Stress: Not on file  Relationships  . Social connections:    Talks on phone: Not on file    Gets together: Not on file    Attends religious service: Not on file    Active member of club or organization: Not on file    Attends meetings of clubs or organizations: Not on file    Relationship status: Not on file  . Intimate partner violence:    Fear of current or ex partner: Not on file    Emotionally abused: Not on file    Physically abused: Not on file    Forced sexual activity: Not on file  Other Topics Concern  . Not on file  Social History Narrative  . Not on file    Allergies  Allergen Reactions  . Codeine Nausea And Vomiting  . Erythromycin Other (See Comments)  . Penicillins Swelling    Outpatient Medications Prior to Visit  Medication Sig Dispense Refill  . acetaminophen (TYLENOL) 500 MG tablet Take 2 tablets  (1,000 mg total) by mouth 2 (two) times daily. 30 tablet 0  . Multiple Vitamin (MULTIVITAMIN) tablet Take 1 tablet by mouth daily.    . [employer] JOHNS WORT PO Take by mouth.    . furosemide (LASIX) 40 MG tablet Take 1 tablet (40 mg total) by mouth daily. 90 tablet 3  . lisinopril (PRINIVIL,ZESTRIL) 40 MG tablet TAKE 1 TABLET DAILY 90 tablet 3  . metFORMIN (GLUCOPHAGE) 1000 MG tablet Take 1 tablet (1,000 mg total) by mouth 2 (two) times daily with a meal. 180 tablet 3  . ranitidine (ZANTAC) 150 MG tablet Take 150 mg by mouth at bedtime. otc    . tiZANidine (ZANAFLEX) 4 MG tablet Take 0.5 tablets (2 mg total) by mouth every 6 (six) hours as needed. Reported on 06/08/2016 (Patient taking differently: Take 2 mg by mouth daily. Reported on 06/08/2016) 30 tablet 2   No facility-administered medications prior to visit.     Review of Systems  Constitutional: Negative for chills, diaphoresis, fatigue, fever, malaise/fatigue and weight loss.  HENT: Negative for congestion, ear discharge, ear pain, hoarse voice and sore throat.  Eyes: Negative for blurred vision.  Respiratory: Positive for cough. Negative for sputum production, choking, shortness of breath and wheezing.   Cardiovascular: Negative for chest pain, palpitations, orthopnea, leg swelling and PND.  Gastrointestinal: Negative for abdominal pain, anorexia, blood in stool, constipation, diarrhea, dysphagia, heartburn, melena and nausea.  Genitourinary: Negative for dysuria, frequency, hematuria, impotence and urgency.  Musculoskeletal: Positive for arthritis and stiffness. Negative for arthralgias, back pain, joint pain, joint swelling, myalgias and neck pain.  Skin: Negative for pallor and rash.  Neurological: Positive for dizziness and vertigo. Negative for tingling, tremors, sensory change, focal weakness, speech difficulty, weakness, numbness and headaches.  Endo/Heme/Allergies: Negative for environmental allergies, polydipsia and polyphagia. Does  not bruise/bleed easily.  Psychiatric/Behavioral: Negative for confusion, depression and suicidal ideas. The patient is not nervous/anxious and does not have insomnia.      Objective  Vitals:   06/19/18 1333  BP: 130/70  Pulse: 88  Weight: (!) 338 lb (153.3 kg)  Height: 5\' 7"  (1.702 m)    Physical Exam  Constitutional: She is oriented to person, place, and time. She appears well-developed and well-nourished.  HENT:  Head: Normocephalic.  Right Ear: External ear normal.  Left Ear: External ear normal.  Mouth/Throat: Oropharynx is clear and moist.  Eyes: Pupils are equal, round, and reactive to light. Conjunctivae and EOM are normal. Lids are everted and swept, no foreign bodies found. Left eye exhibits no hordeolum. No foreign body present in the left eye. Right conjunctiva is not injected. Left conjunctiva is not injected. No scleral icterus.  Neck: Normal range of motion. Neck supple. No JVD present. No tracheal deviation present. No thyromegaly present.  Cardiovascular: Normal rate, regular rhythm, normal heart sounds and intact distal pulses. Exam reveals no gallop and no friction rub.  No murmur heard. Pulmonary/Chest: Effort normal and breath sounds normal. No respiratory distress. She has no wheezes. She has no rales.  Abdominal: Soft. Bowel sounds are normal. She exhibits no mass. There is no hepatosplenomegaly. There is no tenderness. There is no rebound and no guarding.  Musculoskeletal: Normal range of motion. She exhibits no edema or tenderness.  Lymphadenopathy:    She has no cervical adenopathy.  Neurological: She is alert and oriented to person, place, and time. She has normal strength. She displays normal reflexes. No cranial nerve deficit.  Skin: Skin is warm. No rash noted.  Psychiatric: She has a normal mood and affect. Her mood appears not anxious. She does not exhibit a depressed mood.  Nursing note and vitals reviewed.     Assessment & Plan  Problem List  Items Addressed This Visit      Cardiovascular and Mediastinum   Benign essential HTN - Primary    Controlled. Cont furosemide 40 mg and lisinopril 40 mg. Labs in 5 months      Relevant Medications   furosemide (LASIX) 40 MG tablet   lisinopril (PRINIVIL,ZESTRIL) 40 MG tablet     Digestive   GERD (gastroesophageal reflux disease)    Stable Controlled on ranitidine 150 mg      Relevant Medications   ranitidine (ZANTAC) 150 MG tablet     Other   Prediabetes    Last A1c 6.1. Continue metformen 1 gm bid      Relevant Medications   metFORMIN (GLUCOPHAGE) 1000 MG tablet   Lower extremity edema    Patient accepts control furosemide 40 mg daily      Relevant Medications   furosemide (LASIX) 40 MG tablet  Other Visit Diagnoses    Musculoskeletal disease       Patient controls with Zanaflex 1 a day    Relevant Medications   tiZANidine (ZANAFLEX) 4 MG tablet      Meds ordered this encounter  Medications  . tiZANidine (ZANAFLEX) 4 MG tablet    Sig: Take 0.5 tablets (2 mg total) by mouth daily. Reported on 06/08/2016    Dispense:  90 tablet    Refill:  3  . ranitidine (ZANTAC) 150 MG tablet    Sig: Take 1 tablet (150 mg total) by mouth at bedtime. otc    Dispense:  199 tablet    Refill:  1  . metFORMIN (GLUCOPHAGE) 1000 MG tablet    Sig: Take 1 tablet (1,000 mg total) by mouth 2 (two) times daily with a meal.    Dispense:  180 tablet    Refill:  3  . furosemide (LASIX) 40 MG tablet    Sig: Take 1 tablet (40 mg total) by mouth daily.    Dispense:  90 tablet    Refill:  3  . lisinopril (PRINIVIL,ZESTRIL) 40 MG tablet    Sig: Take 1 tablet (40 mg total) by mouth daily.    Dispense:  90 tablet    Refill:  3      Dr. Elizabeth Sauereanna Brinklee Cisse [facility]  06/19/18

## 2018-06-19 NOTE — Patient Instructions (Signed)
Access Code: [email or web address]  URL: [email or web address]  Date: 06/19/2018  Prepared by: Dorene GrebeMichael Krysteena Stalker   Exercises  Isometric Shoulder Abduction at Wall - 10 reps - 2 sets - 5 hold - 1x daily - 7x weekly  Isometric Shoulder Extension at Wall - 10 reps - 2 sets - 5 hold - 1x daily - 7x weekly  Isometric Shoulder Flexion at Wall - 10 reps - 2 sets - 5 hold - 1x daily - 7x weekly  Isometric Shoulder External Rotation at Wall - 10 reps - 2 sets - 5 hold - 1x daily - 7x weekly

## 2018-06-20 NOTE — Therapy (Addendum)
[facility] [address] [place], [place], [address] Phone: [phone]   Fax:  [phone]  Physical Therapy Treatment  Patient Details  Name: Lydia Beard G Hammock MRN: [id number] Date of Birth: Jan 16, 1946 Referring Provider: Dr. Yetta BarreJones   Encounter Date: 06/19/2018    Treatment: 6 of 8.  Recert date: 06/20/18 1439 to 1538    Past Medical History:  Diagnosis Date  . Arthritis   . GERD (gastroesophageal reflux disease)   . Hypertension   . Obesity     Past Surgical History:  Procedure Laterality Date  . COLONOSCOPY  2012   cleared for 5 yrs  . MOUTH SURGERY      There were no vitals filed for this visit.     Pt. Had f/u visit with Dr. Yetta BarreJones last week.  No change in PT order.  Pt. Reports persistent B knee pain (R>L) and states she has been pretty inactive with daily activity.  Pt. Plays on computer a lot during the day.  Pt. C/o 7/10 B knee pain.            There.ex.:  Nustep L5 B UE/LE (165 steps)- warm-up  (6 min.)- pain limited today (pt. Frustrated) Resisted gait in //-bars all 4-planes of movement with moderate cuing to correct upright posture/ increase step pattern.  STS with/without use of Airex pad in front of //-bars Stairs: step ups/ downs (benefits from use of UE for safety/ fall risk)- increase pain on L with descending Standing hip/knee ex. (no wt. Today)- 20x each.  Several seated rest breaks required.           Pt. Pain limited today as compared to previous tx. Session and requires moderate cuing/ encouragement to complete there.ex. Sets/reps.  Increase L knee pain with descending stairs.  Heavy use of B UE on handrails with step ups/ downs.  Recert/ check goals next tx. Session.         PT Long Term Goals - 05/11/191823           PT LONG TERM GOAL #1   Title Pt. independent with HEP to increase B hip/knee strength 1/2 muscle grade to improve standing tolerance/ ther.ex.  participation.    Baseline B quad strength grossly 4+/5 MMT (pain limited)/ hip flexion 4-/5 MMT.   Time 8    Period Weeks    Status New   Target Date 06/21/18       PT LONG TERM GOAL #2   Title Pt. will report <5/10 B knee pain during all aspects of ther.ex.    Baseline 8/10 B knee pain at rest   Time 8    Period Weeks    Status New   Target Date 06/21/18       PT LONG TERM GOAL #3   Title Pt. able to complete 30 minutes of ther.ex. with no increase c/o B knee pain to promote wt. loss/ progression to aquatic ex. program.    Baseline poor endurance/ easily fatigued;    Time 8    Period Weeks    Status New   Target Date 06/21/18       PT LONG TERM GOAL #4   Title Pt. able to ambulate for 10 minutes with consistent gait pattern/ no LOB safely to improve ability to shop/ go to [employer].    [facility] walking distance to mailbox   Time 8    Period Weeks    Status New  Target Date 06/21/18            Patient will benefit from skilled therapeutic intervention in order to improve the following deficits and impairments:     Visit Diagnosis: Chronic pain of both knees  Gait difficulty  Muscle weakness (generalized)  Joint stiffness of both knees     Problem List Patient Active Problem List   Diagnosis Date Noted  . Allergic rhinitis 04/04/2018  . Gait instability 09/28/2017  . Lower extremity edema 05/25/2017  . Leukocytosis 11/24/2016  . Benign essential HTN 10/05/2016  . Depression 09/17/2015  . Prediabetes 09/17/2015  . OA (osteoarthritis) of knee 09/16/2015  . GERD (gastroesophageal reflux disease) 09/16/2015  . Body mass index (BMI) of 50-59.9 in adult [facility]) 06/12/2014   Cammie Mcgee, PT, DPT # [phone] 06/21/2018, 8:51 PM  [facility] [address] [place], [place], [address] Phone: [phone]   Fax:   [phone]  Name: DAVIA SMYRE MRN: [id number] Date of Birth: 01/07/1946

## 2018-06-26 ENCOUNTER — Encounter: Payer: Medicare Other | Admitting: Physical Therapy

## 2018-07-02 NOTE — Addendum Note (Signed)
Addended by: Cammie McgeeSHERK, Maher Shon C on: 07/02/2018 06:33 PM   Modules accepted: Orders

## 2018-07-03 ENCOUNTER — Other Ambulatory Visit: Payer: Self-pay

## 2018-07-03 ENCOUNTER — Ambulatory Visit: Payer: Medicare Other | Admitting: Physical Therapy

## 2018-07-03 ENCOUNTER — Encounter: Payer: Self-pay | Admitting: Physical Therapy

## 2018-07-03 DIAGNOSIS — M25661 Stiffness of right knee, not elsewhere classified: Secondary | ICD-10-CM

## 2018-07-03 DIAGNOSIS — M25561 Pain in right knee: Secondary | ICD-10-CM | POA: Diagnosis not present

## 2018-07-03 DIAGNOSIS — G8929 Other chronic pain: Secondary | ICD-10-CM

## 2018-07-03 DIAGNOSIS — R269 Unspecified abnormalities of gait and mobility: Secondary | ICD-10-CM | POA: Diagnosis not present

## 2018-07-03 DIAGNOSIS — M6281 Muscle weakness (generalized): Secondary | ICD-10-CM | POA: Diagnosis not present

## 2018-07-03 DIAGNOSIS — M25562 Pain in left knee: Principal | ICD-10-CM

## 2018-07-03 DIAGNOSIS — M25662 Stiffness of left knee, not elsewhere classified: Secondary | ICD-10-CM

## 2018-07-03 DIAGNOSIS — K219 Gastro-esophageal reflux disease without esophagitis: Secondary | ICD-10-CM

## 2018-07-03 MED ORDER — RANITIDINE HCL 150 MG PO TABS: 150.0000 mg | ORAL_TABLET | Freq: Two times a day (BID) | ORAL | 0 refills | Status: DC

## 2018-07-03 NOTE — Therapy (Signed)
[place] [facility] [address] [place], [place], [address] Phone: [phone]   Fax:  [phone]  Physical Therapy Treatment  Patient Details  Name: Lydia Beard MRN: [id number] Date of Birth: 04/05/1946 Referring Provider: Dr. Ronnald Ramp   Encounter Date: 07/03/2018  PT End of Session - 07/06/18 0708    Visit Number  7    Number of Visits  14    Date for PT Re-Evaluation  08/28/18    PT Start Time  7616    PT Stop Time  1527    PT Time Calculation (min)  56 min    Activity Tolerance  Patient tolerated treatment well;Patient limited by pain    Behavior During Therapy  [facility] for tasks assessed/performed       Past Medical History:  Diagnosis Date  . Arthritis   . GERD (gastroesophageal reflux disease)   . Hypertension   . Obesity     Past Surgical History:  Procedure Laterality Date  . COLONOSCOPY  2012   cleared for 5 yrs  . MOUTH SURGERY      There were no vitals filed for this visit.      [facility] PT Assessment - 07/06/18 0001      Assessment   Medical Diagnosis  B knee OA/ B knee pain/ Unsteady gait.      Referring Provider  Dr. Ronnald Ramp    Onset Date/Surgical Date  12/19/17      Balance Screen   Has the patient fallen in the past 6 months  No    Has the patient had a decrease in activity level because of a fear of falling?   Yes    Is the patient reluctant to leave their home because of a fear of falling?   Yes      Prior Function   Level of Independence  Independent with household mobility with device       Pt. reports 7/10 B knee pain currently on Nustep. Pt. states she had 2 difficult days in bed (Sunday/Monday) due to back pain and sciatica.       Treatment:   There.ex.:  Nustep L5 12+ min.  (240 steps). Standing //-bars:  hip flexion/ abduction (cuing to keep LE/trunk in midline)/ hamstring curls/ heel raises 30x each. Resisted gait 2BTB 10x all 4-planes (min. To no UE assist).   Walking partial  lunges in //-bars. No stairs today. Sit to stand at varying heights/ Airex. Reviewed current walking/ HEP.      PT Long Term Goals - 07/06/18 0715      PT LONG TERM GOAL #1   Title  Pt. independent with HEP to increase B hip/knee strength 1/2 muscle grade to improve standing tolerance/ ther.ex. participation.      Baseline  B quad strength 4/5 MMT/ hip flexion 4/5 MMT/ hip abduction 5/5 MMT/ adduction 4/5 MMT. Increase pain with knee flexion >100 deg. in seated posture.    Time  8    Period  Weeks    Status  Not Met    Target Date  08/28/18      PT LONG TERM GOAL #2   Title  Pt. will report <5/10 B knee pain during all aspects of ther.ex.    Baseline  7/10 B knee pain at rest    Time  8    Period  Weeks    Status  Not Met    Target Date  08/28/18      PT  LONG TERM GOAL #3   Title  Pt. able to complete 30 minutes of ther.ex. with no increase c/o B knee pain to promote wt. loss/ progression to aquatic ex. program.      Baseline  poor endurance/ easily fatigued.      Time  8    Period  Weeks    Status  Not Met    Target Date  08/28/18      PT LONG TERM GOAL #4   Title  Pt. able to ambulate for 10 minutes with consistent gait pattern/ no LOB safely to improve ability to shop/ go to [employer].      Baseline  using power w/c at [facility]  Limited walking distance to mailbox.      Time  8    Period  Weeks    Status  New    Target Date  08/28/18            Plan - 07/06/18 0709    Clinical Impression Statement  Pt. entered PT with high levels of B knee pain (7/10) and increase c/o back discomfort.  No radicular symptoms at this time.  Pt. reports losing a few pounds over past several weeks.  Pt. is not attending gym or aquatic ex. program.  B quad strength 4/5 MMT/ hip flexion 4/5 MMT/ hip abduction 5/5 MMT/ adduction 4/5 MMT.  Increase pain with knee flexion >100 deg. in seated posture.  Pt. continues to ambulate with limited B knee flexion/ step length with moderate R  antalgic gait pattern.  Sharp increase in R knee pain with descending stairs with use of B UE assist on handrails.  No LOB during tx. session or with resisted gait in //-bars.  Pt. will continue to benefit from skilled PT services to incresae B LE muscle strength/ overall functional mobility to promote pain-free mobility.      Clinical Presentation  Evolving    Clinical Decision Making  Moderate    Rehab Potential  Fair    PT Frequency  1x / week    PT Duration  8 weeks    PT Treatment/Interventions  ADLs/Self Care Home Management;Aquatic Therapy;Cryotherapy;Moist Heat;Gait training;Stair training;Functional mobility training;Therapeutic activities;Therapeutic exercise;Balance training;Patient/family education;Neuromuscular re-education;Manual techniques;Passive range of motion    PT Next Visit Plan  Progress LE strength/ HEP.      PT Home Exercise Plan  see handouts       Patient will benefit from skilled therapeutic intervention in order to improve the following deficits and impairments:  Abnormal gait, Pain, Improper body mechanics, Postural dysfunction, Decreased mobility, Decreased activity tolerance, Decreased endurance, Decreased range of motion, Decreased strength, Hypomobility, Obesity, Impaired flexibility, Difficulty walking, Increased edema, Decreased balance  Visit Diagnosis: Chronic pain of both knees  Gait difficulty  Muscle weakness (generalized)  Joint stiffness of both knees     Problem List Patient Active Problem List   Diagnosis Date Noted  . Allergic rhinitis 04/04/2018  . Gait instability 09/28/2017  . Lower extremity edema 05/25/2017  . Leukocytosis 11/24/2016  . Benign essential HTN 10/05/2016  . Depression 09/17/2015  . Prediabetes 09/17/2015  . OA (osteoarthritis) of knee 09/16/2015  . GERD (gastroesophageal reflux disease) 09/16/2015  . Body mass index (BMI) of 50-59.9 in adult [facility]) 06/12/2014   Pura Spice, PT, DPT # [phone] 07/06/2018, 7:17  AM  [place] [facility] [address] [place], [place], [address] Phone: [phone]   Fax:  [phone]  Name: ZEINA AKKERMAN MRN:  [id number] Date of Birth: 02/25/1946

## 2018-07-09 ENCOUNTER — Ambulatory Visit: Payer: Medicare Other

## 2018-07-09 DIAGNOSIS — G8929 Other chronic pain: Secondary | ICD-10-CM | POA: Diagnosis not present

## 2018-07-09 DIAGNOSIS — M25661 Stiffness of right knee, not elsewhere classified: Secondary | ICD-10-CM

## 2018-07-09 DIAGNOSIS — M25662 Stiffness of left knee, not elsewhere classified: Secondary | ICD-10-CM

## 2018-07-09 DIAGNOSIS — M25561 Pain in right knee: Principal | ICD-10-CM

## 2018-07-09 DIAGNOSIS — R269 Unspecified abnormalities of gait and mobility: Secondary | ICD-10-CM

## 2018-07-09 DIAGNOSIS — M6281 Muscle weakness (generalized): Secondary | ICD-10-CM | POA: Diagnosis not present

## 2018-07-09 DIAGNOSIS — M25562 Pain in left knee: Secondary | ICD-10-CM | POA: Diagnosis not present

## 2018-07-09 NOTE — Therapy (Cosign Needed)
[place] [facility] [address] [place], [place], [address] Phone: [phone]   Fax:  [phone]  Physical Therapy Treatment  Patient Details  Name: Lydia Beard MRN: [id number] Date of Birth: December 04, 1946 Referring Provider: Dr. Ronnald Ramp   Encounter Date: 07/09/2018  PT End of Session - 07/09/18 1553    Visit Number  8    Number of Visits  14    Date for PT Re-Evaluation  08/28/18    PT Start Time  7564    PT Stop Time  1524    PT Time Calculation (min)  56 min    Activity Tolerance  Patient tolerated treatment well;Patient limited by pain    Behavior During Therapy  [facility] for tasks assessed/performed       Past Medical History:  Diagnosis Date  . Arthritis   . GERD (gastroesophageal reflux disease)   . Hypertension   . Obesity     Past Surgical History:  Procedure Laterality Date  . COLONOSCOPY  2012   cleared for 5 yrs  . MOUTH SURGERY      There were no vitals filed for this visit.  Subjective Assessment - 07/09/18 1501    Subjective  Pt. reports 7/10 pain in R knee.  Pt. reports that hand is hurting less from arthritis and is able to do more with her hands.  Pt. reports to not have been compliant with HEP.      Pertinent History  Pt. states R knee is worse than L and pt. uses SPC on R side.  Pt. will occasionally use RW and power chair when at [facility].  Pt. lives a sedentary lifestyle but will walk to mailbox.  Pt. currently not participating with exercise or walking program.  Pt. not able to have TKA until she loses weight.      Limitations  Lifting;Standing;Walking;House hold activities    Patient Stated Goals  Increase B LE muscle strength/ develop exercise program to promote wt. loss and prepare to TKA.      Currently in Pain?  Yes    Pain Score  7     Pain Location  Knee    Pain Orientation  Left;Right    Pain Descriptors / Indicators  Aching    Pain Type  Chronic pain    Pain Onset  More than a month ago     Pain Frequency  Constant        Treatment:   Ther-ex  Nustep L2 12+ min.  (300 steps), unbilled. Standing //-bars:  hip flexion/ abduction (cuing to keep LE/trunk in midline)/ hamstring curls/ heel raises 30x each. Walking partial lunges in //-bars. Ascend/Descend Stairs x5 Sit to stand at varying heights/ Airex. Pt. Educated on importance of HEP and reported that she would begin doing them more often in the morning before eating. Exercises performed slowly with rest break secondary to knee pain.      PT Long Term Goals - 07/06/18 0715      PT LONG TERM GOAL #1   Title  Pt. independent with HEP to increase B hip/knee strength 1/2 muscle grade to improve standing tolerance/ ther.ex. participation.      Baseline  B quad strength 4/5 MMT/ hip flexion 4/5 MMT/ hip abduction 5/5 MMT/ adduction 4/5 MMT. Increase pain with knee flexion >100 deg. in seated posture.    Time  8    Period  Weeks    Status  Not Met    Target Date  08/28/18      PT LONG TERM GOAL #2   Title  Pt. will report <5/10 B knee pain during all aspects of ther.ex.    Baseline  7/10 B knee pain at rest    Time  8    Period  Weeks    Status  Not Met    Target Date  08/28/18      PT LONG TERM GOAL #3   Title  Pt. able to complete 30 minutes of ther.ex. with no increase c/o B knee pain to promote wt. loss/ progression to aquatic ex. program.      Baseline  poor endurance/ easily fatigued.      Time  8    Period  Weeks    Status  Not Met    Target Date  08/28/18      PT LONG TERM GOAL #4   Title  Pt. able to ambulate for 10 minutes with consistent gait pattern/ no LOB safely to improve ability to shop/ go to [employer].      Baseline  using power w/c at [facility].  Limited walking distance to mailbox.      Time  8    Period  Weeks    Status  New    Target Date  08/28/18            Plan - 07/09/18 1829    Clinical Impression Statement  Pt. demonstrated decreased ability to perform exercises today due to  knee discomfort.  Pt. performed well on stairs and is likely an area of concern in order to use larger muscle groups for strengthening and functional movement.    Clinical Presentation  Evolving    Clinical Decision Making  Moderate    Rehab Potential  Fair    PT Frequency  1x / week    PT Duration  8 weeks    PT Treatment/Interventions  ADLs/Self Care Home Management;Aquatic Therapy;Cryotherapy;Moist Heat;Gait training;Stair training;Functional mobility training;Therapeutic activities;Therapeutic exercise;Balance training;Patient/family education;Neuromuscular re-education;Manual techniques;Passive range of motion    PT Next Visit Plan  Progress LE strength/ HEP.      PT Home Exercise Plan  see handouts       Patient will benefit from skilled therapeutic intervention in order to improve the following deficits and impairments:  Abnormal gait, Pain, Improper body mechanics, Postural dysfunction, Decreased mobility, Decreased activity tolerance, Decreased endurance, Decreased range of motion, Decreased strength, Hypomobility, Obesity, Impaired flexibility, Difficulty walking, Increased edema, Decreased balance  Visit Diagnosis: Chronic pain of both knees  Gait difficulty  Muscle weakness (generalized)  Joint stiffness of both knees     Problem List Patient Active Problem List   Diagnosis Date Noted  . Allergic rhinitis 04/04/2018  . Gait instability 09/28/2017  . Lower extremity edema 05/25/2017  . Leukocytosis 11/24/2016  . Benign essential HTN 10/05/2016  . Depression 09/17/2015  . Prediabetes 09/17/2015  . OA (osteoarthritis) of knee 09/16/2015  . GERD (gastroesophageal reflux disease) 09/16/2015  . Body mass index (BMI) of 50-59.9 in adult [facility]) 06/12/2014    This entire session was performed under direct supervision and direction of a licensed therapist/therapist assistant . I have personally read, edited and approve of the note as written.   Gwenlyn Saran SPT Phillips Grout PT, DPT, GCS  Huprich,Jason 07/10/2018, 8:17 AM  [place] [facility] [address] [place], [place], [address] Phone: [phone]   Fax:  [phone]  Name: RALYNN SAN MRN: [id number] Date of  Birth: 03-16-1946

## 2018-07-17 ENCOUNTER — Ambulatory Visit: Payer: Medicare Other | Admitting: Physical Therapy

## 2018-07-17 ENCOUNTER — Encounter: Payer: Self-pay | Admitting: Physical Therapy

## 2018-07-17 DIAGNOSIS — M25661 Stiffness of right knee, not elsewhere classified: Secondary | ICD-10-CM | POA: Diagnosis not present

## 2018-07-17 DIAGNOSIS — M25562 Pain in left knee: Principal | ICD-10-CM

## 2018-07-17 DIAGNOSIS — G8929 Other chronic pain: Secondary | ICD-10-CM | POA: Diagnosis not present

## 2018-07-17 DIAGNOSIS — M25662 Stiffness of left knee, not elsewhere classified: Secondary | ICD-10-CM

## 2018-07-17 DIAGNOSIS — M6281 Muscle weakness (generalized): Secondary | ICD-10-CM | POA: Diagnosis not present

## 2018-07-17 DIAGNOSIS — R269 Unspecified abnormalities of gait and mobility: Secondary | ICD-10-CM

## 2018-07-17 DIAGNOSIS — M25561 Pain in right knee: Secondary | ICD-10-CM | POA: Diagnosis not present

## 2018-07-17 NOTE — Therapy (Signed)
[place] [facility] [address] [place], [place], [address] Phone: [phone]   Fax:  [phone]  Physical Therapy Treatment  Patient Details  Name: Lydia Beard MRN: [id number] Date of Birth: 1946-07-18 Referring Provider: Dr. Ronnald Ramp   Encounter Date: 07/17/2018  PT End of Session - 07/17/18 1445    Visit Number  9    Number of Visits  14    Date for PT Re-Evaluation  08/28/18    PT Start Time  1430    PT Stop Time  1527    PT Time Calculation (min)  57 min    Activity Tolerance  Patient tolerated treatment well;Patient limited by pain    Behavior During Therapy  [facility] for tasks assessed/performed       Past Medical History:  Diagnosis Date  . Arthritis   . GERD (gastroesophageal reflux disease)   . Hypertension   . Obesity     Past Surgical History:  Procedure Laterality Date  . COLONOSCOPY  2012   cleared for 5 yrs  . MOUTH SURGERY      There were no vitals filed for this visit.  Subjective Assessment - 07/17/18 1433    Subjective  Pt. reports lethargy upon entering into the clinic.  Pt. reports R knee pain is 8.5/10.  Pt. reports no fall, and has not been sleeping well.  Pt. reports being more active over the weekend, however not performing HEP while at home.    Pertinent History  Pt. states R knee is worse than L and pt. uses SPC on R side.  Pt. will occasionally use RW and power chair when at [facility].  Pt. lives a sedentary lifestyle but will walk to mailbox.  Pt. currently not participating with exercise or walking program.  Pt. not able to have TKA until she loses weight.      Limitations  Lifting;Standing;Walking;House hold activities    Patient Stated Goals  Increase B LE muscle strength/ develop exercise program to promote wt. loss and prepare to TKA.      Currently in Pain?  Yes    Pain Score  9     Pain Location  Knee    Pain Orientation  Left;Right    Pain Descriptors / Indicators  Aching    Pain Type   Chronic pain    Pain Onset  More than a month ago         Ther-ex  Nustep L7 11:30 min. (185 steps), unbilled. Supine Manually Resisted (min-maximal) Isometric Hip Extension  LAQ, Hamstring/ Glute Activation; with increasd resistance every bout of 5 performed Supine Manually Resisted (min-maximal) Isometric Knee Extension  LAQ, Hamstring/ Glute Activation; with increasd resistance every bout of 5 performed Gait evaluation performed while walking lap around gym for mobility Exercises performed slowly with rest break secondary to knee pain.   Pt. Educated on importance of adhering to HEP and being more active at home.      PT Long Term Goals - 07/06/18 0715      PT LONG TERM GOAL #1   Title  Pt. independent with HEP to increase B hip/knee strength 1/2 muscle grade to improve standing tolerance/ ther.ex. participation.      Baseline  B quad strength 4/5 MMT/ hip flexion 4/5 MMT/ hip abduction 5/5 MMT/ adduction 4/5 MMT. Increase pain with knee flexion >100 deg. in seated posture.    Time  8    Period  Weeks    Status  Not Met    Target Date  08/28/18      PT LONG TERM GOAL #2   Title  Pt. will report <5/10 B knee pain during all aspects of ther.ex.    Baseline  7/10 B knee pain at rest    Time  8    Period  Weeks    Status  Not Met    Target Date  08/28/18      PT LONG TERM GOAL #3   Title  Pt. able to complete 30 minutes of ther.ex. with no increase c/o B knee pain to promote wt. loss/ progression to aquatic ex. program.      Baseline  poor endurance/ easily fatigued.      Time  8    Period  Weeks    Status  Not Met    Target Date  08/28/18      PT LONG TERM GOAL #4   Title  Pt. able to ambulate for 10 minutes with consistent gait pattern/ no LOB safely to improve ability to shop/ go to [employer].      Baseline  using power w/c at [facility].  Limited walking distance to mailbox.      Time  8    Period  Weeks    Status  New    Target Date  08/28/18          Plan  - 07/17/18 1448    Clinical Impression Statement  Pt. unable to perform normal exercises due to fatigue and pain in L hip and R knee.  Pt. able to perform supine exercises as modified version of ther ex.  During supine ther ex., pt. was assess for patellar tracking, which was noted to be gliding well.  Pt's. L hip pain resolved with IR of L hip when performing exercises.  Pt. reported decreased pain when performing exercises in supine and was able to do manually resisted exercises.    Clinical Presentation  Evolving    Clinical Decision Making  Moderate    Rehab Potential  Fair    PT Frequency  1x / week    PT Duration  8 weeks    PT Treatment/Interventions  ADLs/Self Care Home Management;Aquatic Therapy;Cryotherapy;Moist Heat;Gait training;Stair training;Functional mobility training;Therapeutic activities;Therapeutic exercise;Balance training;Patient/family education;Neuromuscular re-education;Manual techniques;Passive range of motion    PT Next Visit Plan  Assess HEP.    PT Home Exercise Plan  see handouts       Patient will benefit from skilled therapeutic intervention in order to improve the following deficits and impairments:  Abnormal gait, Pain, Improper body mechanics, Postural dysfunction, Decreased mobility, Decreased activity tolerance, Decreased endurance, Decreased range of motion, Decreased strength, Hypomobility, Obesity, Impaired flexibility, Difficulty walking, Increased edema, Decreased balance  Visit Diagnosis: Chronic pain of both knees  Gait difficulty  Muscle weakness (generalized)  Joint stiffness of both knees     Problem List Patient Active Problem List   Diagnosis Date Noted  . Allergic rhinitis 04/04/2018  . Gait instability 09/28/2017  . Lower extremity edema 05/25/2017  . Leukocytosis 11/24/2016  . Benign essential HTN 10/05/2016  . Depression 09/17/2015  . Prediabetes 09/17/2015  . OA (osteoarthritis) of knee 09/16/2015  . GERD (gastroesophageal  reflux disease) 09/16/2015  . Body mass index (BMI) of 50-59.9 in adult [facility]) 06/12/2014   Pura Spice, PT, DPT # [id number] [place], SPT 07/18/2018, 12:33 PM  [place] [facility] [address] [place], [place], [address] Phone: [phone]  Fax:  [phone]  Name: DENEENE TARVER MRN: [id number] Date of Birth: 1946-07-21

## 2018-07-24 ENCOUNTER — Ambulatory Visit: Payer: Medicare Other | Attending: Family Medicine | Admitting: Physical Therapy

## 2018-07-24 ENCOUNTER — Encounter: Payer: Self-pay | Admitting: Physical Therapy

## 2018-07-24 DIAGNOSIS — M25661 Stiffness of right knee, not elsewhere classified: Secondary | ICD-10-CM | POA: Diagnosis not present

## 2018-07-24 DIAGNOSIS — M25561 Pain in right knee: Secondary | ICD-10-CM | POA: Insufficient documentation

## 2018-07-24 DIAGNOSIS — M6281 Muscle weakness (generalized): Secondary | ICD-10-CM | POA: Diagnosis not present

## 2018-07-24 DIAGNOSIS — M25662 Stiffness of left knee, not elsewhere classified: Secondary | ICD-10-CM | POA: Insufficient documentation

## 2018-07-24 DIAGNOSIS — M25562 Pain in left knee: Secondary | ICD-10-CM | POA: Insufficient documentation

## 2018-07-24 DIAGNOSIS — G8929 Other chronic pain: Secondary | ICD-10-CM | POA: Diagnosis not present

## 2018-07-24 DIAGNOSIS — R269 Unspecified abnormalities of gait and mobility: Secondary | ICD-10-CM | POA: Insufficient documentation

## 2018-07-24 NOTE — Therapy (Signed)
[facility] [address]. [place], [place], [address] Phone: [phone]   Fax:  [phone]  Physical Therapy Treatment  Patient Details  Name: Lydia Beard MRN: [id number] Date of Birth: 03-21-1946 Referring Provider: Dr. Ronnald Ramp   Encounter Date: 07/24/2018  PT End of Session - 07/24/18 1451    Visit Number  10    Number of Visits  14    Date for PT Re-Evaluation  08/28/18    PT Start Time  4970    PT Stop Time  1529    PT Time Calculation (min)  51 min    Activity Tolerance  Patient tolerated treatment well;Patient limited by pain    Behavior During Therapy  [facility] for tasks assessed/performed       Past Medical History:  Diagnosis Date  . Arthritis   . GERD (gastroesophageal reflux disease)   . Hypertension   . Obesity     Past Surgical History:  Procedure Laterality Date  . COLONOSCOPY  2012   cleared for 5 yrs  . MOUTH SURGERY      There were no vitals filed for this visit.  Subjective Assessment - 07/24/18 1450    Subjective  Pt. reports no new complaints today and feels much better than last visit.  Pt. reports 6.5/10 pain which is down significantly from last week.  Pt. was more active over weekend with the yard sale.  Pt. reports that she performed her HEP two days since last week.    Pertinent History  Pt. states R knee is worse than L and pt. uses SPC on R side.  Pt. will occasionally use RW and power chair when at [facility].  Pt. lives a sedentary lifestyle but will walk to mailbox.  Pt. currently not participating with exercise or walking program.  Pt. not able to have TKA until she loses weight.      Limitations  Lifting;Standing;Walking;House hold activities    Patient Stated Goals  Increase B LE muscle strength/ develop exercise program to promote wt. loss and prepare to TKA.      Currently in Pain?  Yes    Pain Score  7     Pain Location  Knee    Pain Orientation  Left;Right    Pain Onset  More than a  month ago          Treatment:   Ther-ex   Nustep L5 12+ min. (350 steps), unbilled.  Standing //-bars: B Marches/ Abduction/ Extension 2x10 with 4# ankle weights Ascend/Descend Stairs x7 with max UE assist Sit to stand (no airex) 4x5 (no UE support) B Eccentric Step-Up/Step Over 4" step in // bars with UE support  Exercises performed slowly with rest break secondary to knee pain.   Discussed daily walking program and home exercises.       PT Long Term Goals - 07/06/18 0715      PT LONG TERM GOAL #1   Title  Pt. independent with HEP to increase B hip/knee strength 1/2 muscle grade to improve standing tolerance/ ther.ex. participation.      Baseline  B quad strength 4/5 MMT/ hip flexion 4/5 MMT/ hip abduction 5/5 MMT/ adduction 4/5 MMT. Increase pain with knee flexion >100 deg. in seated posture.    Time  8    Period  Weeks    Status  Not Met    Target Date  08/28/18      PT LONG TERM GOAL #2  Title  Pt. will report <5/10 B knee pain during all aspects of ther.ex.    Baseline  7/10 B knee pain at rest    Time  8    Period  Weeks    Status  Not Met    Target Date  08/28/18      PT LONG TERM GOAL #3   Title  Pt. able to complete 30 minutes of ther.ex. with no increase c/o B knee pain to promote wt. loss/ progression to aquatic ex. program.      Baseline  poor endurance/ easily fatigued.      Time  8    Period  Weeks    Status  Not Met    Target Date  08/28/18      PT LONG TERM GOAL #4   Title  Pt. able to ambulate for 10 minutes with consistent gait pattern/ no LOB safely to improve ability to shop/ go to [employer].      Baseline  using power w/c at [facility].  Limited walking distance to mailbox.      Time  8    Period  Weeks    Status  New    Target Date  08/28/18            Plan - 07/24/18 1452    Clinical Presentation  Evolving    Clinical Decision Making  Moderate    Rehab Potential  Fair    PT Frequency  1x / week    PT Duration  8 weeks    PT  Treatment/Interventions  ADLs/Self Care Home Management;Aquatic Therapy;Cryotherapy;Moist Heat;Gait training;Stair training;Functional mobility training;Therapeutic activities;Therapeutic exercise;Balance training;Patient/family education;Neuromuscular re-education;Manual techniques;Passive range of motion    PT Home Exercise Plan  see handouts       Patient will benefit from skilled therapeutic intervention in order to improve the following deficits and impairments:  Abnormal gait, Pain, Improper body mechanics, Postural dysfunction, Decreased mobility, Decreased activity tolerance, Decreased endurance, Decreased range of motion, Decreased strength, Hypomobility, Obesity, Impaired flexibility, Difficulty walking, Increased edema, Decreased balance  Visit Diagnosis: Chronic pain of both knees  Gait difficulty  Muscle weakness (generalized)  Joint stiffness of both knees     Problem List Patient Active Problem List   Diagnosis Date Noted  . Allergic rhinitis 04/04/2018  . Gait instability 09/28/2017  . Lower extremity edema 05/25/2017  . Leukocytosis 11/24/2016  . Benign essential HTN 10/05/2016  . Depression 09/17/2015  . Prediabetes 09/17/2015  . OA (osteoarthritis) of knee 09/16/2015  . GERD (gastroesophageal reflux disease) 09/16/2015  . Body mass index (BMI) of 50-59.9 in adult [facility]) 06/12/2014   Pura Spice, PT, DPT # [id number] Gwenlyn Saran, SPT 07/24/2018, 2:57 PM  [facility] [address] [place], [place], [address] Phone: [phone]   Fax:  [phone]  Name: Lydia Beard MRN: [id number] Date of Birth: [birth year]

## 2018-07-31 ENCOUNTER — Ambulatory Visit: Payer: Medicare Other | Admitting: Physical Therapy

## 2018-07-31 ENCOUNTER — Encounter: Payer: Self-pay | Admitting: Physical Therapy

## 2018-07-31 DIAGNOSIS — R269 Unspecified abnormalities of gait and mobility: Secondary | ICD-10-CM | POA: Diagnosis not present

## 2018-07-31 DIAGNOSIS — M25662 Stiffness of left knee, not elsewhere classified: Secondary | ICD-10-CM

## 2018-07-31 DIAGNOSIS — M25561 Pain in right knee: Secondary | ICD-10-CM | POA: Diagnosis not present

## 2018-07-31 DIAGNOSIS — M25661 Stiffness of right knee, not elsewhere classified: Secondary | ICD-10-CM

## 2018-07-31 DIAGNOSIS — G8929 Other chronic pain: Secondary | ICD-10-CM | POA: Diagnosis not present

## 2018-07-31 DIAGNOSIS — M25562 Pain in left knee: Secondary | ICD-10-CM | POA: Diagnosis not present

## 2018-07-31 DIAGNOSIS — M6281 Muscle weakness (generalized): Secondary | ICD-10-CM | POA: Diagnosis not present

## 2018-07-31 NOTE — Therapy (Signed)
[place] [facility] [address] [place], [place], [address] Phone: [phone]   Fax:  [phone]  Physical Therapy Treatment  Patient Details  Name: Lydia Beard MRN: [id number] Date of Birth: 1946-07-02 Referring Provider: Dr. Ronnald Ramp   Encounter Date: 07/31/2018  PT End of Session - 07/31/18 1450    Visit Number  11    Number of Visits  14    Date for PT Re-Evaluation  08/28/18    PT Start Time  1430    PT Stop Time  1521    PT Time Calculation (min)  51 min    Activity Tolerance  Patient tolerated treatment well;Patient limited by pain    Behavior During Therapy  [facility] for tasks assessed/performed       Past Medical History:  Diagnosis Date  . Arthritis   . GERD (gastroesophageal reflux disease)   . Hypertension   . Obesity     Past Surgical History:  Procedure Laterality Date  . COLONOSCOPY  2012   cleared for 5 yrs  . MOUTH SURGERY      There were no vitals filed for this visit.  Subjective Assessment - 07/31/18 1447    Subjective  Pt. reports no new complaints upon arrival to the clinic today.      Pertinent History  Pt. states R knee is worse than L and pt. uses SPC on R side.  Pt. will occasionally use RW and power chair when at [facility].  Pt. lives a sedentary lifestyle but will walk to mailbox.  Pt. currently not participating with exercise or walking program.  Pt. not able to have TKA until she loses weight.      Limitations  Lifting;Standing;Walking;House hold activities    Patient Stated Goals  Increase B LE muscle strength/ develop exercise program to promote wt. loss and prepare to TKA.      Pain Onset  More than a month ago         Treatment:   Ther-ex  Nustep L612+ min. (300 steps), unbilled.  Standing //-bars: B Marches/ Abduction/ Extension 2x10 with 5# ankle weights Ascend/Descend Stairs x7 with max UE assist  Pt. Progressed with reciprocal gait pattern during ascension of steps.   Sit  to stand 2x10 (no UE support)  Pt. Required much more verbal cueing today than in past sessions, specifically with continuing with exercises (pt. Took several rest breaks today)      PT Long Term Goals - 07/06/18 0715      PT LONG TERM GOAL #1   Title  Pt. independent with HEP to increase B hip/knee strength 1/2 muscle grade to improve standing tolerance/ ther.ex. participation.      Baseline  B quad strength 4/5 MMT/ hip flexion 4/5 MMT/ hip abduction 5/5 MMT/ adduction 4/5 MMT. Increase pain with knee flexion >100 deg. in seated posture.    Time  8    Period  Weeks    Status  Not Met    Target Date  08/28/18      PT LONG TERM GOAL #2   Title  Pt. will report <5/10 B knee pain during all aspects of ther.ex.    Baseline  7/10 B knee pain at rest    Time  8    Period  Weeks    Status  Not Met    Target Date  08/28/18      PT LONG TERM GOAL #3   Title  Pt. able to  complete 30 minutes of ther.ex. with no increase c/o B knee pain to promote wt. loss/ progression to aquatic ex. program.      Baseline  poor endurance/ easily fatigued.      Time  8    Period  Weeks    Status  Not Met    Target Date  08/28/18      PT LONG TERM GOAL #4   Title  Pt. able to ambulate for 10 minutes with consistent gait pattern/ no LOB safely to improve ability to shop/ go to [employer].      Baseline  using power w/c at [facility].  Limited walking distance to mailbox.      Time  8    Period  Weeks    Status  New    Target Date  08/28/18          Plan - 07/31/18 1706    Clinical Impression Statement  Pt. demonstrated decreased focus while performing exercises.  Pt. requires consistant motivation in order to perform exercises.  Pt. did progress with stair climbing today, perfoming reciprocal gait pattern with ascension of the stairs.  Pt. was unable to perform as many flights this week, however technique was much better in past weeks.  Pt. still requires verbal cueing for foot placement and using less  of UE's to assist when descending stairs.    Clinical Presentation  Evolving    Clinical Decision Making  Moderate    Rehab Potential  Fair    PT Frequency  1x / week    PT Duration  8 weeks    PT Treatment/Interventions  ADLs/Self Care Home Management;Aquatic Therapy;Cryotherapy;Moist Heat;Gait training;Stair training;Functional mobility training;Therapeutic activities;Therapeutic exercise;Balance training;Patient/family education;Neuromuscular re-education;Manual techniques;Passive range of motion    PT Next Visit Plan  Ask about HEP and make adjustments as necessary.    PT Home Exercise Plan  see handouts       Patient will benefit from skilled therapeutic intervention in order to improve the following deficits and impairments:  Abnormal gait, Pain, Improper body mechanics, Postural dysfunction, Decreased mobility, Decreased activity tolerance, Decreased endurance, Decreased range of motion, Decreased strength, Hypomobility, Obesity, Impaired flexibility, Difficulty walking, Increased edema, Decreased balance  Visit Diagnosis: Chronic pain of both knees  Gait difficulty  Muscle weakness (generalized)  Joint stiffness of both knees     Problem List Patient Active Problem List   Diagnosis Date Noted  . Allergic rhinitis 04/04/2018  . Gait instability 09/28/2017  . Lower extremity edema 05/25/2017  . Leukocytosis 11/24/2016  . Benign essential HTN 10/05/2016  . Depression 09/17/2015  . Prediabetes 09/17/2015  . OA (osteoarthritis) of knee 09/16/2015  . GERD (gastroesophageal reflux disease) 09/16/2015  . Body mass index (BMI) of 50-59.9 in adult [facility]) 06/12/2014   Pura Spice, PT, DPT # [id number], SPT 08/01/2018, 1:13 PM  [place] [facility] [address] [place], [place], [address] Phone: [phone]   Fax:  [phone]  Name: Lydia Beard MRN: [id number] Date of Birth: 1946-11-14

## 2018-08-07 ENCOUNTER — Encounter: Payer: Self-pay | Admitting: Physical Therapy

## 2018-08-07 ENCOUNTER — Ambulatory Visit: Payer: Medicare Other | Admitting: Physical Therapy

## 2018-08-07 DIAGNOSIS — R269 Unspecified abnormalities of gait and mobility: Secondary | ICD-10-CM

## 2018-08-07 DIAGNOSIS — G8929 Other chronic pain: Secondary | ICD-10-CM | POA: Diagnosis not present

## 2018-08-07 DIAGNOSIS — M25661 Stiffness of right knee, not elsewhere classified: Secondary | ICD-10-CM | POA: Diagnosis not present

## 2018-08-07 DIAGNOSIS — M25561 Pain in right knee: Secondary | ICD-10-CM | POA: Diagnosis not present

## 2018-08-07 DIAGNOSIS — M25562 Pain in left knee: Principal | ICD-10-CM

## 2018-08-07 DIAGNOSIS — M6281 Muscle weakness (generalized): Secondary | ICD-10-CM | POA: Diagnosis not present

## 2018-08-07 DIAGNOSIS — M25662 Stiffness of left knee, not elsewhere classified: Secondary | ICD-10-CM

## 2018-08-07 NOTE — Therapy (Signed)
[facility] [address] [place], [place], [address] Phone: [phone]   Fax:  [phone]  Physical Therapy Treatment  Patient Details  Name: Lydia Beard MRN: [id number] Date of Birth: 22-Apr-1946 Referring Provider: Dr. Ronnald Ramp   Encounter Date: 08/07/2018  PT End of Session - 08/07/18 1921    Visit Number  12    Number of Visits  14    Date for PT Re-Evaluation  08/28/18    PT Start Time  5366    PT Stop Time  1538    PT Time Calculation (min)  69 min    Activity Tolerance  Patient tolerated treatment well;Patient limited by pain    Behavior During Therapy  [facility] for tasks assessed/performed       Past Medical History:  Diagnosis Date  . Arthritis   . GERD (gastroesophageal reflux disease)   . Hypertension   . Obesity     Past Surgical History:  Procedure Laterality Date  . COLONOSCOPY  2012   cleared for 5 yrs  . MOUTH SURGERY      There were no vitals filed for this visit.  Subjective Assessment - 08/07/18 1922    Subjective  Pt. reports increased activity over weekend due to friend coming to visit.  Pt. reports she was able to walk for prolonged periods of time without much increase in pain.  Pt. reports increase energy after exercising    Pertinent History  Pt. states R knee is worse than L and pt. uses SPC on R side.  Pt. will occasionally use RW and power chair when at [facility].  Pt. lives a sedentary lifestyle but will walk to mailbox.  Pt. currently not participating with exercise or walking program.  Pt. not able to have TKA until she loses weight.      Limitations  Lifting;Standing;Walking;House hold activities    Patient Stated Goals  Increase B LE muscle strength/ develop exercise program to promote wt. loss and prepare to TKA.      Currently in Pain?  Yes    Pain Score  3     Pain Location  Knee    Pain Orientation  Right;Left    Pain Descriptors / Indicators  Aching    Pain Type  Chronic pain    Pain Onset  More than a month ago        Treatment:   Ther-ex  Nustep L712+ min. (325steps), unbilled.  Resisted walking in // bars with one BlackTB (Front/Back/Lateral) x15 each Standing //-bars:B Marches/ Abduction/ Extension/ Hamstring Curl 2x10 with 5# ankle weights Ascend/Descend Stairs x8with max UE assist Reviewed HEP Discussed [employer] and benefits of a consistent group based/ aquatic ex. Program.           PT Long Term Goals - 07/06/18 0715      PT LONG TERM GOAL #1   Title  Pt. independent with HEP to increase B hip/knee strength 1/2 muscle grade to improve standing tolerance/ ther.ex. participation.      Baseline  B quad strength 4/5 MMT/ hip flexion 4/5 MMT/ hip abduction 5/5 MMT/ adduction 4/5 MMT. Increase pain with knee flexion >100 deg. in seated posture.    Time  8    Period  Weeks    Status  Not Met    Target Date  08/28/18      PT LONG TERM GOAL #2   Title  Pt. will report <5/10 B knee pain during all aspects  of ther.ex.    Baseline  7/10 B knee pain at rest    Time  8    Period  Weeks    Status  Not Met    Target Date  08/28/18      PT LONG TERM GOAL #3   Title  Pt. able to complete 30 minutes of ther.ex. with no increase c/o B knee pain to promote wt. loss/ progression to aquatic ex. program.      Baseline  poor endurance/ easily fatigued.      Time  8    Period  Weeks    Status  Not Met    Target Date  08/28/18      PT LONG TERM GOAL #4   Title  Pt. able to ambulate for 10 minutes with consistent gait pattern/ no LOB safely to improve ability to shop/ go to [employer].      Baseline  using power w/c at [facility].  Limited walking distance to mailbox.      Time  8    Period  Weeks    Status  New    Target Date  08/28/18            Plan - 08/07/18 1923    Clinical Impression Statement  Pt. performed well today, specifically with ability to perform increase reps of resisted walking with good form.  Pt. is making  significant progress with strengthening exercises and will continue in order to assist pt. with ability to be more mobile and live more active lifestyle.  Pt. was educated on importance of exercising more frequently and was encouraged to join local gym or walk more frequently with daughter and daughter's significant other.  Pt. adamant about not joing gym due to personal reasons but would consider walking with members of her household.      Clinical Presentation  Evolving    Clinical Decision Making  Moderate    Rehab Potential  Fair    PT Frequency  1x / week    PT Duration  8 weeks    PT Treatment/Interventions  ADLs/Self Care Home Management;Aquatic Therapy;Cryotherapy;Moist Heat;Gait training;Stair training;Functional mobility training;Therapeutic activities;Therapeutic exercise;Balance training;Patient/family education;Neuromuscular re-education;Manual techniques;Passive range of motion    PT Next Visit Plan  Ask about HEP and make adjustments as necessary.    PT Home Exercise Plan  see handouts       Patient will benefit from skilled therapeutic intervention in order to improve the following deficits and impairments:  Abnormal gait, Pain, Improper body mechanics, Postural dysfunction, Decreased mobility, Decreased activity tolerance, Decreased endurance, Decreased range of motion, Decreased strength, Hypomobility, Obesity, Impaired flexibility, Difficulty walking, Increased edema, Decreased balance  Visit Diagnosis: Chronic pain of both knees  Gait difficulty  Muscle weakness (generalized)  Joint stiffness of both knees     Problem List Patient Active Problem List   Diagnosis Date Noted  . Allergic rhinitis 04/04/2018  . Gait instability 09/28/2017  . Lower extremity edema 05/25/2017  . Leukocytosis 11/24/2016  . Benign essential HTN 10/05/2016  . Depression 09/17/2015  . Prediabetes 09/17/2015  . OA (osteoarthritis) of knee 09/16/2015  . GERD (gastroesophageal reflux  disease) 09/16/2015  . Body mass index (BMI) of 50-59.9 in adult [facility]) 06/12/2014   Pura Spice, PT, DPT # [id number]  [place], SPT 08/08/2018, 2:53 PM  [place] [facility] [address] [place], [place], [address] Phone: [phone]   Fax:  [phone]  Name: Lydia Beard MRN: [id number]  Date of Birth: 1946-10-18

## 2018-08-14 ENCOUNTER — Ambulatory Visit: Payer: Medicare Other | Admitting: Physical Therapy

## 2018-08-14 DIAGNOSIS — M6281 Muscle weakness (generalized): Secondary | ICD-10-CM

## 2018-08-14 DIAGNOSIS — M25662 Stiffness of left knee, not elsewhere classified: Secondary | ICD-10-CM

## 2018-08-14 DIAGNOSIS — M25561 Pain in right knee: Principal | ICD-10-CM

## 2018-08-14 DIAGNOSIS — M25661 Stiffness of right knee, not elsewhere classified: Secondary | ICD-10-CM

## 2018-08-14 DIAGNOSIS — R269 Unspecified abnormalities of gait and mobility: Secondary | ICD-10-CM | POA: Diagnosis not present

## 2018-08-14 DIAGNOSIS — G8929 Other chronic pain: Secondary | ICD-10-CM | POA: Diagnosis not present

## 2018-08-14 DIAGNOSIS — M25562 Pain in left knee: Secondary | ICD-10-CM | POA: Diagnosis not present

## 2018-08-14 NOTE — Therapy (Signed)
[facility] [address] [place], [place], [address] Phone: [phone]   Fax:  [phone]  Physical Therapy Treatment  Patient Details  Name: Lydia Beard MRN: [id number] Date of Birth: 20-Oct-1946 Referring Provider: Dr. Ronnald Ramp   Encounter Date: 08/14/2018  PT End of Session - 08/14/18 1538    Visit Number  13    Number of Visits  14    Date for PT Re-Evaluation  08/28/18    PT Start Time  9030    PT Stop Time  1542    PT Time Calculation (min)  51 min    Activity Tolerance  Patient tolerated treatment well;Patient limited by pain    Behavior During Therapy  [facility] for tasks assessed/performed       Past Medical History:  Diagnosis Date  . Arthritis   . GERD (gastroesophageal reflux disease)   . Hypertension   . Obesity     Past Surgical History:  Procedure Laterality Date  . COLONOSCOPY  2012   cleared for 5 yrs  . MOUTH SURGERY      There were no vitals filed for this visit.  Subjective Assessment - 08/14/18 1519    Subjective  Pt. reports stiffness in L knee today.  Pt. reports she has a few errands to run today after session and would consist of walking in and out of stores.  Pt. reports not feeling all that well and demeanor is less bubbly than usual.    Pertinent History  Pt. states R knee is worse than L and pt. uses SPC on R side.  Pt. will occasionally use RW and power chair when at [facility].  Pt. lives a sedentary lifestyle but will walk to mailbox.  Pt. currently not participating with exercise or walking program.  Pt. not able to have TKA until she loses weight.      Limitations  Lifting;Standing;Walking;House hold activities    Patient Stated Goals  Increase B LE muscle strength/ develop exercise program to promote wt. loss and prepare to TKA.      Currently in Pain?  Yes    Pain Score  8     Pain Location  Knee    Pain Descriptors / Indicators  Aching    Pain Type  Chronic pain    Pain Onset  More  than a month ago           Treatment:   Ther-ex  Nustep L511 min. (350 steps), unbilled.  Standing //-bars: B Marches/ Abduction/ Extension 2x10 with 5# ankle weights Ascend/Descend Stairs x5 with max UE assist  Pt. Able to perform reciprocal gait 1/2 of the time ascending. Sit to stand  (airex pad placed for support) 2x10 (no UE support)   Exercises performed slowly with rest break secondary to knee pain.  Discussed daily walking program again.  Pt. Was uninterested.      PT Long Term Goals - 07/06/18 0715      PT LONG TERM GOAL #1   Title  Pt. independent with HEP to increase B hip/knee strength 1/2 muscle grade to improve standing tolerance/ ther.ex. participation.      Baseline  B quad strength 4/5 MMT/ hip flexion 4/5 MMT/ hip abduction 5/5 MMT/ adduction 4/5 MMT. Increase pain with knee flexion >100 deg. in seated posture.    Time  8    Period  Weeks    Status  Not Met    Target Date  08/28/18  PT LONG TERM GOAL #2   Title  Pt. will report <5/10 B knee pain during all aspects of ther.ex.    Baseline  7/10 B knee pain at rest    Time  8    Period  Weeks    Status  Not Met    Target Date  08/28/18      PT LONG TERM GOAL #3   Title  Pt. able to complete 30 minutes of ther.ex. with no increase c/o B knee pain to promote wt. loss/ progression to aquatic ex. program.      Baseline  poor endurance/ easily fatigued.      Time  8    Period  Weeks    Status  Not Met    Target Date  08/28/18      PT LONG TERM GOAL #4   Title  Pt. able to ambulate for 10 minutes with consistent gait pattern/ no LOB safely to improve ability to shop/ go to [employer].      Baseline  using power w/c at [facility].  Limited walking distance to mailbox.      Time  8    Period  Weeks    Status  New    Target Date  08/28/18         Plan - 08/14/18 1539    Clinical Impression Statement  Pt. very lethargic today during exercises and less conversational.  Pt. did perform well  with exercises once performing, however had a harder time getting started.  Pt. did noticed pain in L hip when stepping down from stairs and was educated on the importance of stepping down with a proper gait pattern and not externally rotating the LE.  Pt. found it to be less painful when taking step down with foot straight.  Pt. will continue with strengthening exercises in future sessions.      Clinical Presentation  Evolving    Clinical Decision Making  Moderate    Rehab Potential  Fair    PT Frequency  1x / week    PT Duration  8 weeks    PT Treatment/Interventions  ADLs/Self Care Home Management;Aquatic Therapy;Cryotherapy;Moist Heat;Gait training;Stair training;Functional mobility training;Therapeutic activities;Therapeutic exercise;Balance training;Patient/family education;Neuromuscular re-education;Manual techniques;Passive range of motion    PT Next Visit Plan  Ask about HEP and make adjustments as necessary.    PT Home Exercise Plan  see handouts       Patient will benefit from skilled therapeutic intervention in order to improve the following deficits and impairments:  Abnormal gait, Pain, Improper body mechanics, Postural dysfunction, Decreased mobility, Decreased activity tolerance, Decreased endurance, Decreased range of motion, Decreased strength, Hypomobility, Obesity, Impaired flexibility, Difficulty walking, Increased edema, Decreased balance  Visit Diagnosis: Chronic pain of both knees  Gait difficulty  Muscle weakness (generalized)  Joint stiffness of both knees     Problem List Patient Active Problem List   Diagnosis Date Noted  . Allergic rhinitis 04/04/2018  . Gait instability 09/28/2017  . Lower extremity edema 05/25/2017  . Leukocytosis 11/24/2016  . Benign essential HTN 10/05/2016  . Depression 09/17/2015  . Prediabetes 09/17/2015  . OA (osteoarthritis) of knee 09/16/2015  . GERD (gastroesophageal reflux disease) 09/16/2015  . Body mass index (BMI) of  50-59.9 in adult [facility]) 06/12/2014   Pura Spice, PT, DPT # [id number] Gwenlyn Saran, SPT 08/14/2018, 5:29 PM  [facility] [address] [place], [place], [address] Phone: [phone]   Fax:  [phone]  Name: Lydia Beard MRN: [id number] Date of Birth: [birth year]

## 2018-08-15 ENCOUNTER — Encounter: Payer: Self-pay | Admitting: Physical Therapy

## 2018-08-21 ENCOUNTER — Encounter: Payer: Medicare Other | Admitting: Physical Therapy

## 2018-08-28 ENCOUNTER — Ambulatory Visit: Payer: Medicare Other | Attending: Family Medicine | Admitting: Physical Therapy

## 2018-08-28 ENCOUNTER — Encounter: Payer: Self-pay | Admitting: Physical Therapy

## 2018-08-28 DIAGNOSIS — G8929 Other chronic pain: Secondary | ICD-10-CM | POA: Insufficient documentation

## 2018-08-28 DIAGNOSIS — M25562 Pain in left knee: Secondary | ICD-10-CM | POA: Insufficient documentation

## 2018-08-28 DIAGNOSIS — M6281 Muscle weakness (generalized): Secondary | ICD-10-CM | POA: Insufficient documentation

## 2018-08-28 DIAGNOSIS — R269 Unspecified abnormalities of gait and mobility: Secondary | ICD-10-CM | POA: Insufficient documentation

## 2018-08-28 DIAGNOSIS — M25661 Stiffness of right knee, not elsewhere classified: Secondary | ICD-10-CM | POA: Diagnosis not present

## 2018-08-28 DIAGNOSIS — M25561 Pain in right knee: Secondary | ICD-10-CM | POA: Insufficient documentation

## 2018-08-28 DIAGNOSIS — M25662 Stiffness of left knee, not elsewhere classified: Secondary | ICD-10-CM | POA: Insufficient documentation

## 2018-08-28 NOTE — Therapy (Signed)
[facility] [address] [place], [place], [address] Phone: [phone]   Fax:  [phone]  Physical Therapy Treatment  Patient Details  Name: Lydia Beard MRN: [id number] Date of Birth: 10/06/1946 Referring Provider: Dr. Ronnald Ramp   Encounter Date: 08/28/2018   Treatment: 14 of 14.  Discharge today: 08/28/18   Past Medical History:  Diagnosis Date  . Arthritis   . GERD (gastroesophageal reflux disease)   . Hypertension   . Obesity     Past Surgical History:  Procedure Laterality Date  . COLONOSCOPY  2012   cleared for 5 yrs  . MOUTH SURGERY      There were no vitals filed for this visit.    Pt. reports that pain is still present, however has been able to do ADLs without much difficulty. Pt. not lively upon walking into therapy. Pt. understood today was last visit on schedule and d/c would be likely. Pt. open to d/c option however reported that she would not be going to gym for exercise after being d/c.     Treatment:   Ther-ex  Nustep L712+ min. (400steps), unbilled.  Resisted walking in // bars with two BlackTB (Front/Back/Lateral) x8 each Standing //-bars:B Marches/ Abduction/ Extension/ Hamstring Curl 2x10 with5# ankle weights Sled 70# Push/Pull down hallway x1 each   Goal Reassessment and HEP Educated on performing outside clinic to remain active.  Pt. Not favorable to gym based-exercises program.      PT Long Term Goals - 08/28/18 1440      PT LONG TERM GOAL #1   Title  Pt. independent with HEP to increase B hip/knee strength 1/2 muscle grade to improve standing tolerance/ ther.ex. participation.    Baseline  B quad strength 4/5 MMT/ hip flexion 4/5 MMT/ hip abduction 5/5 MMT/ adduction 4/5 MMT. Increase pain with knee flexion >100 deg. in seated posture; 08/28/18 B quad strength 5/5 MMT/ hip flexion 5/5 MMT/ hip abduction 5/5 MMT/ adduction 5/5 MMT. Increase pain with knee flexion >100 deg.  in seated posture    Time  8    Period  Weeks    Status  Achieved    Target Date  08/28/18      PT LONG TERM GOAL #2   Title  Pt. will report <5/10 B knee pain during all aspects of ther.ex.    Baseline  7/10 B knee pain at rest; 08/28/18 7/10 B knee pain.    Time  8    Period  Weeks    Status  Not Met    Target Date  08/28/18      PT LONG TERM GOAL #3   Title  Pt. able to complete 30 minutes of ther.ex. with no increase c/o B knee pain to promote wt. loss/ progression to aquatic ex. program.      Baseline  poor endurance/ easily fatigued;     Time  8    Period  Weeks    Status  Not Met    Target Date  08/28/18      PT LONG TERM GOAL #4   Title  Pt. able to ambulate for 10 minutes with consistent gait pattern/ no LOB safely to improve ability to shop/ go to [employer].      Baseline  using power w/c at [facility].  Limited walking distance to mailbox.; Pt. self-reports 10 minutes of ambulation.    Time  8    Period  Weeks    Status  Not  Met    Target Date  08/28/18        Pt. performed well today with reluctance to ther ex. Pt. continues to perform well with goal assessment and is making improvements towards goals, achieving 2/4 goals set. Pt. is now able to walk longer distances whenever necessary however pt. loses motivation for exercises/activities due to knee pain. Pt. educated on importance of staying active and the potential well-being of joining a local gym, however pt. acts adverse towards idea of gym atmosphere. Pt. educated on change in [place] by being d/c today, and reported willingness to be d/c and to possibly be re-evaluated after MD visit in December. Pt. encouraged to reach out to clinic if any regression occurs following d/c.        Patient will benefit from skilled therapeutic intervention in order to improve the following deficits and impairments:  Abnormal gait, Pain, Improper body mechanics, Postural dysfunction, Decreased mobility, Decreased activity tolerance,  Decreased endurance, Decreased range of motion, Decreased strength, Hypomobility, Obesity, Impaired flexibility, Difficulty walking, Increased edema, Decreased balance  Visit Diagnosis: Chronic pain of both knees  Gait difficulty  Muscle weakness (generalized)  Joint stiffness of both knees     Problem List Patient Active Problem List   Diagnosis Date Noted  . Allergic rhinitis 04/04/2018  . Gait instability 09/28/2017  . Lower extremity edema 05/25/2017  . Leukocytosis 11/24/2016  . Benign essential HTN 10/05/2016  . Depression 09/17/2015  . Prediabetes 09/17/2015  . OA (osteoarthritis) of knee 09/16/2015  . GERD (gastroesophageal reflux disease) 09/16/2015  . Body mass index (BMI) of 50-59.9 in adult [facility]) 06/12/2014   Pura Spice, PT, DPT # [place], SPT 08/30/2018, 7:43 AM  [facility] [address] [place], [place], [address] Phone: [phone]   Fax:  [phone]  Name: Lydia Beard MRN: [id number] Date of Birth: [birth year]

## 2018-09-25 ENCOUNTER — Ambulatory Visit: Payer: Medicare Other | Admitting: Family Medicine

## 2018-10-03 ENCOUNTER — Other Ambulatory Visit: Payer: Self-pay | Admitting: Family Medicine

## 2018-10-03 DIAGNOSIS — K219 Gastro-esophageal reflux disease without esophagitis: Secondary | ICD-10-CM

## 2018-11-27 ENCOUNTER — Ambulatory Visit (INDEPENDENT_AMBULATORY_CARE_PROVIDER_SITE_OTHER): Payer: Medicare Other | Admitting: Family Medicine

## 2018-11-27 ENCOUNTER — Encounter: Payer: Self-pay | Admitting: Family Medicine

## 2018-11-27 VITALS — BP 130/70 | HR 68 | Ht 67.0 in | Wt 339.0 lb

## 2018-11-27 DIAGNOSIS — Z6841 Body Mass Index (BMI) 40.0 and over, adult: Secondary | ICD-10-CM

## 2018-11-27 DIAGNOSIS — I10 Essential (primary) hypertension: Secondary | ICD-10-CM

## 2018-11-27 DIAGNOSIS — R600 Localized edema: Secondary | ICD-10-CM | POA: Diagnosis not present

## 2018-11-27 DIAGNOSIS — K219 Gastro-esophageal reflux disease without esophagitis: Secondary | ICD-10-CM

## 2018-11-27 DIAGNOSIS — M25552 Pain in left hip: Secondary | ICD-10-CM | POA: Diagnosis not present

## 2018-11-27 DIAGNOSIS — R7303 Prediabetes: Secondary | ICD-10-CM

## 2018-11-27 MED ORDER — METFORMIN HCL 1000 MG PO TABS: 1000.0000 mg | ORAL_TABLET | Freq: Two times a day (BID) | ORAL | 1 refills | Status: DC

## 2018-11-27 MED ORDER — FUROSEMIDE 40 MG PO TABS: 40.0000 mg | ORAL_TABLET | Freq: Every day | ORAL | 1 refills | Status: DC

## 2018-11-27 MED ORDER — RANITIDINE HCL 150 MG PO TABS: 150.0000 mg | ORAL_TABLET | Freq: Two times a day (BID) | ORAL | 1 refills | Status: DC

## 2018-11-27 MED ORDER — LISINOPRIL 40 MG PO TABS: 40.0000 mg | ORAL_TABLET | Freq: Every day | ORAL | 1 refills | Status: DC

## 2018-11-27 NOTE — Progress Notes (Signed)
Date:  11/27/2018   Name:  Lydia Beard   DOB:  06-13-1946   MRN:  [id number]   Chief Complaint: Diabetes (prediabetes- doesn't check sugar at home); Leg Swelling (takes furosemide for this); Hip Pain (L) hip pain- getting worse); Hypertension; and Gastroesophageal Reflux Diabetes  She presents for her follow-up diabetic visit. Diabetes type: prediabetes. Her disease course has been stable. Pertinent negatives for hypoglycemia include no confusion, dizziness, headaches, hunger, mood changes, nervousness/anxiousness, pallor, seizures, sleepiness, speech difficulty, sweats or tremors. Pertinent negatives for diabetes include no blurred vision, no chest pain, no fatigue, no foot paresthesias, no foot ulcerations, no polydipsia, no polyphagia, no polyuria, no visual change, no weakness and no weight loss. There are no hypoglycemic complications. Symptoms are stable. There are no diabetic complications. Pertinent negatives for diabetic complications include no CVA, PVD or retinopathy. Risk factors for coronary artery disease include dyslipidemia, hypertension and obesity. Current diabetic treatment includes oral agent (monotherapy) and diet. An ACE inhibitor/angiotensin II receptor blocker is being taken.  Hip Pain   Incident onset: 2-3 months. There was no injury mechanism. The pain is present in the left hip. The quality of the pain is described as aching. The pain is at a severity of 3/10. The pain is mild. The pain has been worsening since onset. Pertinent negatives include no numbness or tingling. The symptoms are aggravated by movement, weight bearing and palpation. She has tried NSAIDs and acetaminophen for the symptoms. The treatment provided mild relief.  Hypertension  This is a chronic problem. The current episode started more than 1 year ago. The problem is controlled. Pertinent negatives include no anxiety, blurred vision, chest pain, headaches, malaise/fatigue, neck pain, peripheral  edema, PND, shortness of breath or sweats. Past treatments include ACE inhibitors. The current treatment provides moderate improvement. There are no compliance problems.  There is no history of angina, kidney disease, CAD/MI, CVA, heart failure, left ventricular hypertrophy, PVD or retinopathy. There is no history of chronic renal disease, a hypertension causing med or renovascular disease.  Gastroesophageal Reflux  She complains of heartburn. She reports no abdominal pain, no chest pain, no choking, no coughing, no dysphagia, no nausea, no sore throat or no wheezing. This is a chronic problem. The problem occurs frequently. The problem has been gradually worsening. The heartburn is of mild intensity. The symptoms are aggravated by certain foods. Pertinent negatives include no fatigue or weight loss.     Review of Systems  Constitutional: Negative.  Negative for chills, fatigue, fever, malaise/fatigue, unexpected weight change and weight loss.  HENT: Negative for congestion, ear discharge, ear pain, rhinorrhea, sinus pressure, sneezing and sore throat.   Eyes: Negative for blurred vision, photophobia, pain, discharge, redness and itching.  Respiratory: Negative for cough, choking, shortness of breath, wheezing and stridor.   Cardiovascular: Negative for chest pain and PND.  Gastrointestinal: Positive for heartburn. Negative for abdominal pain, blood in stool, constipation, diarrhea, dysphagia, nausea and vomiting.  Endocrine: Negative for cold intolerance, heat intolerance, polydipsia, polyphagia and polyuria.  Genitourinary: Negative for dysuria, flank pain, frequency, hematuria, menstrual problem, pelvic pain, urgency, vaginal bleeding and vaginal discharge.  Musculoskeletal: Negative for arthralgias, back pain, myalgias and neck pain.  Skin: Negative for pallor and rash.  Allergic/Immunologic: Negative for environmental allergies and food allergies.  Neurological: Negative for dizziness,  tingling, tremors, seizures, speech difficulty, weakness, light-headedness, numbness and headaches.  Hematological: Negative for adenopathy. Does not bruise/bleed easily.  Psychiatric/Behavioral: Negative for confusion and dysphoric mood. The patient  is not nervous/anxious.     Patient Active Problem List   Diagnosis Date Noted  . Allergic rhinitis 04/04/2018  . Gait instability 09/28/2017  . Lower extremity edema 05/25/2017  . Leukocytosis 11/24/2016  . Benign essential HTN 10/05/2016  . Depression 09/17/2015  . Prediabetes 09/17/2015  . OA (osteoarthritis) of knee 09/16/2015  . GERD (gastroesophageal reflux disease) 09/16/2015  . Body mass index (BMI) of 50-59.9 in adult [facility]) 06/12/2014    Allergies  Allergen Reactions  . Codeine Nausea And Vomiting  . Erythromycin Other (See Comments)  . Penicillins Swelling    Past Surgical History:  Procedure Laterality Date  . COLONOSCOPY  2012   cleared for 5 yrs  . MOUTH SURGERY      Social History   Tobacco Use  . Smoking status: Former Smoker    Last attempt to quit: 12/19/1966    Years since quitting: 51.9  . Smokeless tobacco: Never Used  Substance Use Topics  . Alcohol use: Yes    Alcohol/week: 0.0 standard drinks  . Drug use: No     Medication list has been reviewed and updated.  Current Meds  Medication Sig  . acetaminophen (TYLENOL) 500 MG tablet Take 2 tablets (1,000 mg total) by mouth 2 (two) times daily.  . furosemide (LASIX) 40 MG tablet Take 1 tablet (40 mg total) by mouth daily.  Marland Kitchen lisinopril (PRINIVIL,ZESTRIL) 40 MG tablet Take 1 tablet (40 mg total) by mouth daily.  . metFORMIN (GLUCOPHAGE) 1000 MG tablet Take 1 tablet (1,000 mg total) by mouth 2 (two) times daily with a meal.  . Multiple Vitamin (MULTIVITAMIN) tablet Take 1 tablet by mouth daily.  . ranitidine (ZANTAC) 150 MG tablet Take 1 tablet (150 mg total) by mouth 2 (two) times daily.  . [facility] [place] WORT PO Take 300 mg by mouth daily.   Marland Kitchen tiZANidine  (ZANAFLEX) 4 MG tablet Take 0.5 tablets (2 mg total) by mouth daily. Reported on 06/08/2016  . [DISCONTINUED] furosemide (LASIX) 40 MG tablet Take 1 tablet (40 mg total) by mouth daily.  . [DISCONTINUED] lisinopril (PRINIVIL,ZESTRIL) 40 MG tablet Take 1 tablet (40 mg total) by mouth daily.  . [DISCONTINUED] metFORMIN (GLUCOPHAGE) 1000 MG tablet Take 1 tablet (1,000 mg total) by mouth 2 (two) times daily with a meal.  . [DISCONTINUED] ranitidine (ZANTAC) 150 MG tablet TAKE 1 TABLET TWICE A DAY    PHQ 2/9 Scores 11/27/2018 06/19/2018 06/19/2018 05/25/2017  PHQ - 2 Score 0 1 1 0  PHQ- 9 Score 3 5 - -    Physical Exam  Constitutional: She is oriented to person, place, and time. She appears well-developed and well-nourished.  HENT:  Head: Normocephalic.  Right Ear: External ear normal.  Left Ear: External ear normal.  Mouth/Throat: Oropharynx is clear and moist.  Eyes: Pupils are equal, round, and reactive to light. Conjunctivae and EOM are normal. Lids are everted and swept, no foreign bodies found. Left eye exhibits no hordeolum. No foreign body present in the left eye. Right conjunctiva is not injected. Left conjunctiva is not injected. No scleral icterus.  Neck: Normal range of motion. Neck supple. No JVD present. No tracheal deviation present. No thyromegaly present.  Cardiovascular: Normal rate, regular rhythm, normal heart sounds and intact distal pulses. Exam reveals no gallop and no friction rub.  No murmur heard. Pulmonary/Chest: Effort normal and breath sounds normal. No respiratory distress. She has no wheezes. She has no rales.  Abdominal: Soft. Bowel sounds are normal. She exhibits no  mass. There is no hepatosplenomegaly. There is no tenderness. There is no rebound and no guarding.  Musculoskeletal: Normal range of motion. She exhibits no edema or tenderness.  Lymphadenopathy:    She has no cervical adenopathy.  Neurological: She is alert and oriented to person, place, and time. She has  normal strength. She displays normal reflexes. No cranial nerve deficit.  Skin: Skin is warm. No rash noted.  Psychiatric: She has a normal mood and affect. Her mood appears not anxious. She does not exhibit a depressed mood.  Nursing note and vitals reviewed.   BP 130/70   Pulse 68   Ht 5\' 7"  (1.702 m)   Wt (!) 339 lb (153.8 kg)   BMI 53.09 kg/m   Assessment and Plan: 1. BMI 50.0-59.9, adult (HCC) Discussed diet and exercise/ gave diet sheet  2. Benign essential HTN Chronic, stable on meds- refill lisinopril and furosemide/ draw renal panel - lisinopril (PRINIVIL,ZESTRIL) 40 MG tablet; Take 1 tablet (40 mg total) by mouth daily.  Dispense: 90 tablet; Refill: 1 - furosemide (LASIX) 40 MG tablet; Take 1 tablet (40 mg total) by mouth daily.  Dispense: 90 tablet; Refill: 1 - Renal Function Panel  3. Lower extremity edema Chronic, stable on meds- refill furosemide - furosemide (LASIX) 40 MG tablet; Take 1 tablet (40 mg total) by mouth daily.  Dispense: 90 tablet; Refill: 1  4. Gastroesophageal reflux disease, esophagitis presence not specified Chronic, stable on med- refill ranitidine - ranitidine (ZANTAC) 150 MG tablet; Take 1 tablet (150 mg total) by mouth 2 (two) times daily.  Dispense: 180 tablet; Refill: 1  5. Prediabetes Chronic, stable on med- refill metformin/ draw renal and A1C/ advised to check BS once a week  - metFORMIN (GLUCOPHAGE) 1000 MG tablet; Take 1 tablet (1,000 mg total) by mouth 2 (two) times daily with a meal.  Dispense: 180 tablet; Refill: 1 - Renal Function Panel - Hemoglobin A1c  6. Pain of left hip joint Hip pain that has gradually gotten worse. Referral to ortho for further evaluation - Ambulatory referral to Orthopedic Surgery      Dr. Elizabeth Sauereanna Jones [facility]  11/27/2018

## 2018-11-27 NOTE — Patient Instructions (Signed)

## 2018-11-28 LAB — RENAL FUNCTION PANEL
Albumin: 4.6 g/dL (ref 3.5–4.8)
BUN/Creatinine Ratio: 26 (ref 12–28)
BUN: 25 mg/dL (ref 8–27)
CO2: 23 mmol/L (ref 20–29)
CREATININE: 0.96 mg/dL (ref 0.57–1.00)
Calcium: 9.8 mg/dL (ref 8.7–10.3)
Chloride: 100 mmol/L (ref 96–106)
GFR calc Af Amer: 69 mL/min/{1.73_m2} (ref >59)
GFR calc non Af Amer: 60 mL/min/{1.73_m2} (ref >59)
Glucose: 102 mg/dL — ABNORMAL HIGH (ref 65–99)
Phosphorus: 3.6 mg/dL (ref 2.5–4.5)
Potassium: 4.4 mmol/L (ref 3.5–5.2)
Sodium: 141 mmol/L (ref 134–144)

## 2018-11-28 LAB — HEMOGLOBIN A1C
ESTIMATED AVERAGE GLUCOSE: 126 mg/dL
Hgb A1c MFr Bld: 6 % — ABNORMAL HIGH (ref 4.8–5.6)

## 2018-12-04 DIAGNOSIS — Z6841 Body Mass Index (BMI) 40.0 and over, adult: Secondary | ICD-10-CM | POA: Diagnosis not present

## 2018-12-04 DIAGNOSIS — G8929 Other chronic pain: Secondary | ICD-10-CM | POA: Diagnosis not present

## 2018-12-04 DIAGNOSIS — M1612 Unilateral primary osteoarthritis, left hip: Secondary | ICD-10-CM | POA: Diagnosis not present

## 2018-12-04 DIAGNOSIS — M25552 Pain in left hip: Secondary | ICD-10-CM | POA: Diagnosis not present

## 2018-12-18 DIAGNOSIS — M1612 Unilateral primary osteoarthritis, left hip: Secondary | ICD-10-CM | POA: Diagnosis not present

## 2018-12-18 DIAGNOSIS — G8929 Other chronic pain: Secondary | ICD-10-CM | POA: Diagnosis not present

## 2018-12-18 DIAGNOSIS — M25552 Pain in left hip: Secondary | ICD-10-CM | POA: Diagnosis not present

## 2019-01-02 ENCOUNTER — Ambulatory Visit (INDEPENDENT_AMBULATORY_CARE_PROVIDER_SITE_OTHER): Payer: Medicare Other

## 2019-01-02 VITALS — BP 136/82 | HR 90 | Temp 97.5°F | Resp 16 | Ht 67.0 in | Wt 335.6 lb

## 2019-01-02 DIAGNOSIS — Z780 Asymptomatic menopausal state: Secondary | ICD-10-CM | POA: Diagnosis not present

## 2019-01-02 DIAGNOSIS — Z0000 Encounter for general adult medical examination without abnormal findings: Secondary | ICD-10-CM | POA: Diagnosis not present

## 2019-01-02 NOTE — Patient Instructions (Signed)
Ms. Lydia Beard , Thank you for taking time to come for your Medicare Wellness Visit. I appreciate your ongoing commitment to your health goals. Please review the following plan we discussed and let me know if I can assist you in the future.   Screening recommendations/referrals: Colonoscopy: done in 2012 repeat in 2022 Mammogram: postponed  Bone Density: ordered today. Please call [phone] to schedule your bone density exam.  Recommended yearly ophthalmology/optometry visit for glaucoma screening and checkup Recommended yearly dental visit for hygiene and checkup  Vaccinations: Influenza vaccine: postponed //Pneumococcal vaccine: done 04/04/18 Tdap vaccine: done 09/16/15 Shingles vaccine: Shingrix discussed. Please contact your pharmacy for coverage information.    Advanced directives: Advance directive discussed with you today. I have provided a copy for you to complete at home and have notarized. Once this is complete please bring a copy in to our office so we can scan it into your chart.  Conditions/risks identified: Recommend health habits for weight loss to increase opportunity for knee surgery.  Next appointment: Please follow up in one year for your Medicare Annual Wellness visit.     Preventive Care 6265 Years and Older, Female Preventive care refers to lifestyle choices and visits with your health care provider that can promote health and wellness. What does preventive care include?  A yearly physical exam. This is also called an annual well check.  Dental exams once or twice a year.  Routine eye exams. Ask your health care provider how often you should have your eyes checked.  Personal lifestyle choices, including:  Daily care of your teeth and gums.  Regular physical activity.  Eating a healthy diet.  Avoiding tobacco and drug use.  Limiting alcohol use.  Practicing safe sex.  Taking low-dose aspirin every day.  Taking vitamin and mineral supplements as  recommended by your health care provider. What happens during an annual well check? The services and screenings done by your health care provider during your annual well check will depend on your age, overall health, lifestyle risk factors, and family history of disease. Counseling  Your health care provider may ask you questions about your:  Alcohol use.  Tobacco use.  Drug use.  Emotional well-being.  Home and relationship well-being.  Sexual activity.  Eating habits.  History of falls.  Memory and ability to understand (cognition).  Work and work Astronomerenvironment.  Reproductive health. Screening  You may have the following tests or measurements:  Height, weight, and BMI.  Blood pressure.  Lipid and cholesterol levels. These may be checked every 5 years, or more frequently if you are over 71 years old.  Skin check.  Lung cancer screening. You may have this screening every year starting at age 71 if you have a 30-pack-year history of smoking and currently smoke or have quit within the past 15 years.  Fecal occult blood test (FOBT) of the stool. You may have this test every year starting at age 71.  Flexible sigmoidoscopy or colonoscopy. You may have a sigmoidoscopy every 5 years or a colonoscopy every 10 years starting at age 71.  Hepatitis C blood test.  Hepatitis B blood test.  Sexually transmitted disease (STD) testing.  Diabetes screening. This is done by checking your blood sugar (glucose) after you have not eaten for a while (fasting). You may have this done every 1-3 years.  Bone density scan. This is done to screen for osteoporosis. You may have this done starting at age 71.  Mammogram. This may be done every 1-2  years. Talk to your health care provider about how often you should have regular mammograms. Talk with your health care provider about your test results, treatment options, and if necessary, the need for more tests. Vaccines  Your health care  provider may recommend certain vaccines, such as:  Influenza vaccine. This is recommended every year.  Tetanus, diphtheria, and acellular pertussis (Tdap, Td) vaccine. You may need a Td booster every 10 years.  Zoster vaccine. You may need this after age 43.  Pneumococcal 13-valent conjugate (PCV13) vaccine. One dose is recommended after age 89.  Pneumococcal polysaccharide (PPSV23) vaccine. One dose is recommended after age 61. Talk to your health care provider about which screenings and vaccines you need and how often you need them. This information is not intended to replace advice given to you by your health care provider. Make sure you discuss any questions you have with your health care provider. Document Released: 01/01/2016 Document Revised: 08/24/2016 Document Reviewed: 10/06/2015 Elsevier Interactive Patient Education  2017 [place] Prevention in the Home Falls can cause injuries. They can happen to people of all ages. There are many things you can do to make your home safe and to help prevent falls. What can I do on the outside of my home?  Regularly fix the edges of walkways and driveways and fix any cracks.  Remove anything that might make you trip as you walk through a door, such as a raised step or threshold.  Trim any bushes or trees on the path to your home.  Use bright outdoor lighting.  Clear any walking paths of anything that might make someone trip, such as rocks or tools.  Regularly check to see if handrails are loose or broken. Make sure that both sides of any steps have handrails.  Any raised decks and porches should have guardrails on the edges.  Have any leaves, snow, or ice cleared regularly.  Use sand or salt on walking paths during winter.  Clean up any spills in your garage right away. This includes oil or grease spills. What can I do in the bathroom?  Use night lights.  Install grab bars by the toilet and in the tub and shower. Do  not use towel bars as grab bars.  Use non-skid mats or decals in the tub or shower.  If you need to sit down in the shower, use a plastic, non-slip stool.  Keep the floor dry. Clean up any water that spills on the floor as soon as it happens.  Remove soap buildup in the tub or shower regularly.  Attach bath mats securely with double-sided non-slip rug tape.  Do not have throw rugs and other things on the floor that can make you trip. What can I do in the bedroom?  Use night lights.  Make sure that you have a light by your bed that is easy to reach.  Do not use any sheets or blankets that are too big for your bed. They should not hang down onto the floor.  Have a firm chair that has side arms. You can use this for support while you get dressed.  Do not have throw rugs and other things on the floor that can make you trip. What can I do in the kitchen?  Clean up any spills right away.  Avoid walking on wet floors.  Keep items that you use a lot in easy-to-reach places.  If you need to reach something above you, use a strong step  stool that has a grab bar.  Keep electrical cords out of the way.  Do not use floor polish or wax that makes floors slippery. If you must use wax, use non-skid floor wax.  Do not have throw rugs and other things on the floor that can make you trip. What can I do with my stairs?  Do not leave any items on the stairs.  Make sure that there are handrails on both sides of the stairs and use them. Fix handrails that are broken or loose. Make sure that handrails are as long as the stairways.  Check any carpeting to make sure that it is firmly attached to the stairs. Fix any carpet that is loose or worn.  Avoid having throw rugs at the top or bottom of the stairs. If you do have throw rugs, attach them to the floor with carpet tape.  Make sure that you have a light switch at the top of the stairs and the bottom of the stairs. If you do not have them,  ask someone to add them for you. What else can I do to help prevent falls?  Wear shoes that:  Do not have high heels.  Have rubber bottoms.  Are comfortable and fit you well.  Are closed at the toe. Do not wear sandals.  If you use a stepladder:  Make sure that it is fully opened. Do not climb a closed stepladder.  Make sure that both sides of the stepladder are locked into place.  Ask someone to hold it for you, if possible.  Clearly mark and make sure that you can see:  Any grab bars or handrails.  First and last steps.  Where the edge of each step is.  Use tools that help you move around (mobility aids) if they are needed. These include:  Canes.  Walkers.  Scooters.  Crutches.  Turn on the lights when you go into a dark area. Replace any light bulbs as soon as they burn out.  Set up your furniture so you have a clear path. Avoid moving your furniture around.  If any of your floors are uneven, fix them.  If there are any pets around you, be aware of where they are.  Review your medicines with your doctor. Some medicines can make you feel dizzy. This can increase your chance of falling. Ask your doctor what other things that you can do to help prevent falls. This information is not intended to replace advice given to you by your health care provider. Make sure you discuss any questions you have with your health care provider. Document Released: 10/01/2009 Document Revised: 05/12/2016 Document Reviewed: 01/09/2015 Elsevier Interactive Patient Education  2017 [employer].

## 2019-01-02 NOTE — Progress Notes (Signed)
Subjective:   Lydia Beard is a 73 y.o. female who presents for an Initial Medicare Annual Wellness Visit.  Review of Systems      Cardiac Risk Factors include: advanced age (>57men, >70 women);hypertension;obesity (BMI >30kg/m2)     Objective:    Today's Vitals   01/02/19 1454 01/02/19 1457  BP: 136/82   Pulse: 90   Resp: 16   Temp: (!) 97.5 F (36.4 C)   TempSrc: Oral   SpO2: 95%   Weight: (!) 335 lb 9.6 oz (152.2 kg)   Height: 5\' 7"  (1.702 m)   PainSc:  7    Body mass index is 52.56 kg/m.  Advanced Directives 01/02/2019 09/28/2017 03/13/2017 09/16/2015  Does Patient Have a Medical Advance Directive? No No No No  Would patient like information on creating a medical advance directive? Yes (MAU/Ambulatory/Procedural Areas - Information given) - - No - patient declined information    Current Medications (verified) Outpatient Encounter Medications as of 01/02/2019  Medication Sig  . acetaminophen (TYLENOL) 500 MG tablet Take 2 tablets (1,000 mg total) by mouth 2 (two) times daily.  . furosemide (LASIX) 40 MG tablet Take 1 tablet (40 mg total) by mouth daily.  Marland Kitchen lisinopril (PRINIVIL,ZESTRIL) 40 MG tablet Take 1 tablet (40 mg total) by mouth daily.  . meclizine (ANTIVERT) 25 MG tablet Take by mouth.  . metFORMIN (GLUCOPHAGE) 1000 MG tablet Take 1 tablet (1,000 mg total) by mouth 2 (two) times daily with a meal.  . Multiple Vitamin (MULTIVITAMIN) tablet Take 1 tablet by mouth daily.  . naproxen sodium (ALEVE) 220 MG tablet Take by mouth.  . ranitidine (ZANTAC) 150 MG tablet Take 1 tablet (150 mg total) by mouth 2 (two) times daily.  . ST JOHNS WORT PO Take 300 mg by mouth daily.   Marland Kitchen tiZANidine (ZANAFLEX) 4 MG tablet Take 0.5 tablets (2 mg total) by mouth daily. Reported on 06/08/2016   No facility-administered encounter medications on file as of 01/02/2019.     Allergies (verified) Codeine; Erythromycin; and Penicillins   History: Past Medical History:  Diagnosis  Date  . Arthritis   . GERD (gastroesophageal reflux disease)   . Hypertension   . Obesity   . Prediabetes    Past Surgical History:  Procedure Laterality Date  . COLONOSCOPY  2012   cleared for 5 yrs  . MOUTH SURGERY     Family History  Problem Relation Age of Onset  . Cancer Mother        cervical  . Diabetes Mother   . Diabetes Father   . Heart disease Father   . Hypertension Father    Social History   Socioeconomic History  . Marital status: Widowed    Spouse name: Not on file  . Number of children: 2  . Years of education: Not on file  . Highest education level: High school graduate  Occupational History  . Occupation: retired  Engineer, production  . Financial resource strain: Not hard at all  . Food insecurity:    Worry: Never true    Inability: Never true  . Transportation needs:    Medical: No    Non-medical: No  Tobacco Use  . Smoking status: Former Smoker    Last attempt to quit: 12/19/1966    Years since quitting: 52.0  . Smokeless tobacco: Never Used  Substance and Sexual Activity  . Alcohol use: Yes    Alcohol/week: 0.0 standard drinks  . Drug use: No  . Sexual  activity: Not Currently  Lifestyle  . Physical activity:    Days per week: 0 days    Minutes per session: 0 min  . Stress: Only a little  Relationships  . Social connections:    Talks on phone: More than three times a week    Gets together: More than three times a week    Attends religious service: Never    Active member of club or organization: No    Attends meetings of clubs or organizations: Never    Relationship status: Widowed  Other Topics Concern  . Not on file  Social History Narrative  . Not on file    Tobacco Counseling Counseling given: Not Answered   Clinical Intake:  Pre-visit preparation completed: Yes  Pain : 0-10 Pain Score: 7  Pain Type: Chronic pain Pain Location: Knee Pain Orientation: Right, Left Pain Descriptors / Indicators: Aching Pain Onset: More than  a month ago Pain Frequency: Constant     Nutritional Status: BMI > 30  Obese Nutritional Risks: None Diabetes: No  How often do you need to have someone help you when you read instructions, pamphlets, or other written materials from your doctor or pharmacy?: 1 - Never What is the last grade level you completed in school?: high school graduate  Interpreter Needed?: No  Information entered by :: Reather Littler LPN   Activities of Daily Living In your present state of health, do you have any difficulty performing the following activities: 01/02/2019  Hearing? N  Comment declines hearing aids  Vision? Y  Comment wears glasses, cataracts developing  Difficulty concentrating or making decisions? N  Walking or climbing stairs? N  Dressing or bathing? N  Doing errands, shopping? N  Preparing Food and eating ? N  Using the Toilet? N  In the past six months, have you accidently leaked urine? N  Do you have problems with loss of bowel control? N  Managing your Medications? N  Managing your Finances? N  Housekeeping or managing your Housekeeping? N  Some recent data might be hidden     Immunizations and Health Maintenance Immunization History  Administered Date(s) Administered  . Pneumococcal Conjugate-13 11/24/2016  . Pneumococcal Polysaccharide-23 04/04/2018  . Tdap 09/16/2015   There are no preventive care reminders to display for this patient.  Patient Care Team: Duanne Limerick, MD as PCP - General (Family Medicine)  Indicate any recent Medical Services you may have received from other than [facility] providers in the past year (date may be approximate).     Assessment:   This is a routine wellness examination for Lydia Beard.  Hearing/Vision screen Hearing Screening Comments: Pt has no difficulty hearing  Vision Screening Comments: Annual vision screenings done [facility]  Dietary issues and exercise activities discussed: Current Exercise Habits:  The patient does not participate in regular exercise at present, Exercise limited by: orthopedic condition(s)  Goals    . Weight (lb) < 200 lb (90.7 kg)     Pt would like to lose enough weight to have knee surgery       Depression Screen PHQ 2/9 Scores 01/02/2019 11/27/2018 06/19/2018 06/19/2018 05/25/2017 03/13/2017 06/08/2016  PHQ - 2 Score 0 0 1 1 0 0 0  PHQ- 9 Score 3 3 5  - - - -    Fall Risk Fall Risk  01/02/2019 11/27/2018 06/19/2018 05/25/2017 03/13/2017  Falls in the past year? 0 0 No No No  Number falls in past yr: 0 - - - -  Risk for fall due to : Impaired balance/gait Impaired balance/gait - - -  Follow up Falls evaluation completed;Falls prevention discussed Falls evaluation completed;Falls prevention discussed - - -   FALL RISK PREVENTION PERTAINING TO THE HOME:   Any stairs in or around the home WITH handrails? Yes  Home free of loose throw rugs in walkways, pet beds, electrical cords, etc? Yes  Adequate lighting in your home to reduce risk of falls? Yes   ASSISTIVE DEVICES UTILIZED TO PREVENT FALLS:  Life alert? No  Use of a cane, walker or w/c? Yes  Grab bars in the bathroom? No  Shower chair or bench in shower? No  Elevated toilet seat or a handicapped toilet? No   DME ORDERS:  DME order needed?  YES - pt would like shower chair - see nurse notes  TIMED UP AND GO:  Was the test performed? Yes .  Length of time to ambulate 10 feet: 7 sec.   GAIT:  Appearance of gait: Gait slow, steady and with the use of an assistive device.  Education: Fall risk prevention has been discussed.  Intervention(s) required? No    Cognitive Function:     6CIT Screen 01/02/2019  What Year? 0 points  What month? 0 points  What time? 0 points  Count back from 20 0 points  Months in reverse 0 points  Repeat phrase 0 points  Total Score 0    Screening Tests Health Maintenance  Topic Date Due  . INFLUENZA VACCINE  01/09/2019 (Originally 07/19/2018)  . MAMMOGRAM  11/28/2019  (Originally 02/08/1998)  . DEXA SCAN  11/28/2019 (Originally 02/08/2013)  . COLONOSCOPY  12/19/2020  . TETANUS/TDAP  09/15/2025  . Hepatitis C Screening  Completed  . PNA vac Low Risk Adult  Completed    Qualifies for Shingles Vaccine? Yes . Due for Shingrix. Education has been provided regarding the importance of this vaccine. Pt has been advised to call insurance company to determine out of pocket expense. Advised may also receive vaccine at local pharmacy or Health Dept. Verbalized acceptance and understanding.  Tdap: Up to date  Flu Vaccine: Due for Flu vaccine. Does the patient want to receive this vaccine today?  No . Education has been provided regarding the importance of this vaccine but still declined. Advised may receive this vaccine at local pharmacy or Health Dept. Aware to provide a copy of the vaccination record if obtained from local pharmacy or Health Dept. Verbalized acceptance and understanding.  Pneumococcal Vaccine: Up to date  Cancer Screenings:  Colorectal Screening: Completed 2012. Repeat every 10 years;   Mammogram: pt declines mammogram screening  Bone Density: No prior screening. Ordered today. Pt provided with contact information and advised to call to schedule appt.   Lung Cancer Screening: (Low Dose CT Chest recommended if Age 69-80 years, 30 pack-year currently smoking OR have quit w/in 15years.) does not qualify.    Additional Screening:  Hepatitis C Screening: does qualify; Completed 09/28/17  Vision Screening: Recommended annual ophthalmology exams for early detection of glaucoma and other disorders of the eye. Is the patient up to date with their annual eye exam?  Yes  Who is the provider or what is the name of the office in which the pt attends annual eye exams? Dr. Clearance CootsShade  Dental Screening: Recommended annual dental exams for proper oral hygiene  Community Resource Referral:  CRR required this visit?  No      Plan:     I have personally  reviewed and  addressed the Medicare Annual Wellness questionnaire and have noted the following in the patient's chart:  A. Medical and social history B. Use of alcohol, tobacco or illicit drugs  C. Current medications and supplements D. Functional ability and status E.  Nutritional status F.  Physical activity G. Advance directives H. List of other physicians I.  Hospitalizations, surgeries, and ER visits in previous 12 months J.  Vitals K. Screenings such as hearing and vision if needed, cognitive and depression L. Referrals and appointments   In addition, I have reviewed and discussed with patient certain preventive protocols, quality metrics, and best practice recommendations. A written personalized care plan for preventive services as well as general preventive health recommendations were provided to patient.   Signed,  Reather LittlerKasey Bodee Lafoe, LPN Nurse Health Advisor   Nurse Notes: Pt accompanied to visit today by her daughter Wynona CanesChristine. Pt states her ranitidine is currently unavailable through her mail order pharmacy with possibility of back order. A copay prescription is less expensive for her than OTC meds and would like an alternative sent in if possible in case the ranitidine is not back in stock soon.   Pt would also like prescription for shower chair to aid in independence and safety. She currently uses her cane to help with mobility and stability to get in and out of the shower but would like a shower chair. They are planning to install grab bars in and around the shower to help with safety as well.   Pt very pleasant and appreciative of visit today.

## 2019-01-03 ENCOUNTER — Other Ambulatory Visit: Payer: Self-pay

## 2019-01-03 MED ORDER — OMEPRAZOLE 40 MG PO CPDR: 40.0000 mg | DELAYED_RELEASE_CAPSULE | Freq: Every day | ORAL | 0 refills | Status: DC

## 2019-02-04 ENCOUNTER — Inpatient Hospital Stay: Admission: RE | Admit: 2019-02-04 | Payer: Medicare Other | Source: Ambulatory Visit

## 2019-02-05 ENCOUNTER — Other Ambulatory Visit: Payer: Self-pay | Admitting: Family Medicine

## 2019-02-05 DIAGNOSIS — K219 Gastro-esophageal reflux disease without esophagitis: Secondary | ICD-10-CM

## 2019-02-12 ENCOUNTER — Other Ambulatory Visit: Payer: Medicare Other

## 2019-05-27 ENCOUNTER — Other Ambulatory Visit: Payer: Self-pay | Admitting: Family Medicine

## 2019-05-27 DIAGNOSIS — I10 Essential (primary) hypertension: Secondary | ICD-10-CM

## 2019-05-27 DIAGNOSIS — R600 Localized edema: Secondary | ICD-10-CM

## 2019-05-28 ENCOUNTER — Ambulatory Visit: Payer: Medicare Other | Admitting: Family Medicine

## 2019-05-28 ENCOUNTER — Other Ambulatory Visit: Payer: Self-pay

## 2019-05-31 ENCOUNTER — Encounter: Payer: Self-pay | Admitting: Family Medicine

## 2019-06-03 ENCOUNTER — Other Ambulatory Visit: Payer: Self-pay | Admitting: Family Medicine

## 2019-06-04 ENCOUNTER — Ambulatory Visit (INDEPENDENT_AMBULATORY_CARE_PROVIDER_SITE_OTHER): Payer: Medicare Other | Admitting: Family Medicine

## 2019-06-04 ENCOUNTER — Encounter: Payer: Self-pay | Admitting: Family Medicine

## 2019-06-04 ENCOUNTER — Other Ambulatory Visit: Payer: Self-pay

## 2019-06-04 DIAGNOSIS — I10 Essential (primary) hypertension: Secondary | ICD-10-CM | POA: Diagnosis not present

## 2019-06-04 DIAGNOSIS — R600 Localized edema: Secondary | ICD-10-CM | POA: Diagnosis not present

## 2019-06-04 DIAGNOSIS — M799 Soft tissue disorder, unspecified: Secondary | ICD-10-CM

## 2019-06-04 DIAGNOSIS — R7303 Prediabetes: Secondary | ICD-10-CM | POA: Diagnosis not present

## 2019-06-04 MED ORDER — OMEPRAZOLE 40 MG PO CPDR: 40.0000 mg | DELAYED_RELEASE_CAPSULE | Freq: Every day | ORAL | 1 refills | Status: DC

## 2019-06-04 MED ORDER — FUROSEMIDE 40 MG PO TABS: 40.0000 mg | ORAL_TABLET | Freq: Every day | ORAL | 1 refills | Status: DC

## 2019-06-04 MED ORDER — TIZANIDINE HCL 4 MG PO TABS: 2.0000 mg | ORAL_TABLET | Freq: Every day | ORAL | 3 refills | Status: DC

## 2019-06-04 MED ORDER — LISINOPRIL 40 MG PO TABS: 40.0000 mg | ORAL_TABLET | Freq: Every day | ORAL | 1 refills | Status: DC

## 2019-06-04 MED ORDER — METFORMIN HCL 1000 MG PO TABS: 1000.0000 mg | ORAL_TABLET | Freq: Two times a day (BID) | ORAL | 1 refills | Status: DC

## 2019-06-04 NOTE — Progress Notes (Signed)
Date:  06/04/2019   Name:  Lydia Beard   DOB:  1946-08-08   MRN:  [id number]   Chief Complaint: Diabetes (does not check sugar at home), Hypertension, Edema (furosemide not helping?), and Gastroesophageal Reflux  Diabetes She presents for her follow-up (for prediabetic) diabetic visit. Disease course: unchange. Pertinent negatives for hypoglycemia include no confusion, dizziness, headaches, seizures, sweats or tremors. Associated symptoms include blurred vision. Pertinent negatives for diabetes include no chest pain, no fatigue, no foot paresthesias, no foot ulcerations, no polydipsia, no polyphagia, no polyuria, no visual change, no weakness and no weight loss. (Furosemide) There are no hypoglycemic complications. Symptoms are stable. There are no diabetic complications. Pertinent negatives for diabetic complications include no CVA, PVD or retinopathy. Risk factors for coronary artery disease include diabetes mellitus, dyslipidemia, hypertension and obesity. Current diabetic treatment includes oral agent (monotherapy). She is compliant with treatment all of the time. Her weight is decreasing steadily. She is following a generally healthy diet. Meal planning includes avoidance of concentrated sweets and carbohydrate counting. Home blood sugar record trend: no self check. An ACE inhibitor/angiotensin II receptor blocker is being taken. She does not see a podiatrist.Eye exam is not current (appt next week).  Hypertension This is a chronic problem. The current episode started more than 1 year ago. The problem has been gradually improving since onset. The problem is controlled. Associated symptoms include blurred vision, palpitations and shortness of breath. Pertinent negatives include no anxiety, chest pain, headaches, malaise/fatigue, neck pain, orthopnea, peripheral edema, PND or sweats. There are no associated agents (sleep "pretty good") to hypertension. Past treatments include ACE inhibitors  and diuretics. There are no compliance problems.  There is no history of angina, kidney disease, CAD/MI, CVA, heart failure, left ventricular hypertrophy, PVD or retinopathy. There is no history of chronic renal disease, a hypertension causing med or renovascular disease.  Gastroesophageal Reflux She reports no abdominal pain, no belching, no chest pain, no choking, no coughing, no dysphagia, no early satiety, no globus sensation, no heartburn, no hoarse voice, no nausea, no sore throat, no stridor, no tooth decay, no water brash or no wheezing. This is a chronic problem. The current episode started more than 1 year ago. The problem has been waxing and waning. The symptoms are aggravated by certain foods. Pertinent negatives include no anemia, fatigue, melena, muscle weakness, orthopnea or weight loss. Risk factors include obesity. She has tried a PPI for the symptoms. The treatment provided mild relief. Past invasive treatments do not include gastroplasty, gastroplication or reflux surgery.    Review of Systems  Constitutional: Negative for activity change, appetite change, chills, diaphoresis, fatigue, fever, malaise/fatigue, unexpected weight change and weight loss.  HENT: Negative for congestion, ear pain, facial swelling, hoarse voice, postnasal drip, rhinorrhea, sinus pressure, sinus pain and sore throat.   Eyes: Positive for blurred vision. Negative for photophobia, pain, discharge, redness, itching and visual disturbance.  Respiratory: Positive for shortness of breath. Negative for apnea, cough, choking, chest tightness, wheezing and stridor.   Cardiovascular: Positive for palpitations and leg swelling. Negative for chest pain, orthopnea and PND.  Gastrointestinal: Negative for abdominal distention, abdominal pain, anal bleeding, blood in stool, constipation, diarrhea, dysphagia, heartburn, melena, nausea, rectal pain and vomiting.  Endocrine: Negative for cold intolerance, heat intolerance,  polydipsia, polyphagia and polyuria.  Genitourinary: Positive for urgency. Negative for difficulty urinating, dyspareunia, dysuria, enuresis, flank pain, frequency, menstrual problem and pelvic pain.  Musculoskeletal: Positive for arthralgias and myalgias. Negative for back pain,  muscle weakness and neck pain.  Skin: Negative for color change.  Neurological: Negative for dizziness, tremors, seizures, facial asymmetry, weakness, numbness and headaches.  Hematological: Negative for adenopathy.  Psychiatric/Behavioral: Negative for confusion.    Patient Active Problem List   Diagnosis Date Noted  . Primary osteoarthritis of left hip 12/04/2018  . Allergic rhinitis 04/04/2018  . Gait instability 09/28/2017  . Lower extremity edema 05/25/2017  . Leukocytosis 11/24/2016  . Benign essential HTN 10/05/2016  . Depression 09/17/2015  . Prediabetes 09/17/2015  . OA (osteoarthritis) of knee 09/16/2015  . GERD (gastroesophageal reflux disease) 09/16/2015  . Body mass index (BMI) of 50-59.9 in adult [facility]) 06/12/2014    Allergies  Allergen Reactions  . Codeine Nausea And Vomiting  . Erythromycin Other (See Comments)  . Penicillins Swelling    Past Surgical History:  Procedure Laterality Date  . COLONOSCOPY  2012   cleared for 5 yrs  . MOUTH SURGERY      Social History   Tobacco Use  . Smoking status: Former Smoker    Quit date: 12/19/1966    Years since quitting: 52.4  . Smokeless tobacco: Never Used  Substance Use Topics  . Alcohol use: Yes    Alcohol/week: 0.0 standard drinks  . Drug use: No     Medication list has been reviewed and updated.  Current Meds  Medication Sig  . acetaminophen (TYLENOL) 500 MG tablet Take 2 tablets (1,000 mg total) by mouth 2 (two) times daily.  . furosemide (LASIX) 40 MG tablet TAKE 1 TABLET DAILY  . lisinopril (PRINIVIL,ZESTRIL) 40 MG tablet Take 1 tablet (40 mg total) by mouth daily.  . metFORMIN (GLUCOPHAGE) 1000 MG tablet Take 1 tablet  (1,000 mg total) by mouth 2 (two) times daily with a meal.  . Multiple Vitamin (MULTIVITAMIN) tablet Take 1 tablet by mouth daily.  . naproxen sodium (ALEVE) 220 MG tablet Take by mouth.  Marland Kitchen. omeprazole (PRILOSEC) 40 MG capsule TAKE 1 CAPSULE DAILY  . ST JOHNS WORT PO Take 300 mg by mouth daily.   Marland Kitchen. tiZANidine (ZANAFLEX) 4 MG tablet Take 0.5 tablets (2 mg total) by mouth daily. Reported on 06/08/2016    PHQ 2/9 Scores 06/04/2019 01/02/2019 11/27/2018 06/19/2018  PHQ - 2 Score 0 0 0 1  PHQ- 9 Score 0 3 3 5     BP Readings from Last 3 Encounters:  06/04/19 108/68  01/02/19 136/82  11/27/18 130/70    Physical Exam Vitals signs and nursing note reviewed.  Constitutional:      Appearance: She is well-developed. She is obese. She is not ill-appearing.  HENT:     Head: Normocephalic.     Right Ear: Tympanic membrane, ear canal and external ear normal.     Left Ear: Tympanic membrane, ear canal and external ear normal.     Nose: Nose normal. No congestion or rhinorrhea.     Mouth/Throat:     Mouth: Mucous membranes are moist.  Eyes:     General: Lids are everted, no foreign bodies appreciated. No scleral icterus.       Left eye: No foreign body or hordeolum.     Conjunctiva/sclera: Conjunctivae normal.     Right eye: Right conjunctiva is not injected.     Left eye: Left conjunctiva is not injected.     Pupils: Pupils are equal, round, and reactive to light.  Neck:     Musculoskeletal: Normal range of motion and neck supple.     Thyroid: No thyromegaly.  Vascular: No JVD.     Trachea: No tracheal deviation.  Cardiovascular:     Rate and Rhythm: Normal rate and regular rhythm.     Heart sounds: Normal heart sounds, S1 normal and S2 normal. No murmur. No systolic murmur. No diastolic murmur. No friction rub. No gallop. No S3 or S4 sounds.   Pulmonary:     Effort: Pulmonary effort is normal. No respiratory distress.     Breath sounds: Normal breath sounds. No decreased air movement. No  decreased breath sounds, wheezing, rhonchi or rales.  Abdominal:     General: Bowel sounds are normal.     Palpations: Abdomen is soft. There is no hepatomegaly, splenomegaly or mass.     Tenderness: There is no abdominal tenderness. There is no guarding or rebound.  Musculoskeletal: Normal range of motion.        General: No tenderness.     Right lower leg: 3+ Edema present.     Left lower leg: 3+ Edema present.  Lymphadenopathy:     Cervical: No cervical adenopathy.  Skin:    General: Skin is warm.     Findings: No rash.  Neurological:     Mental Status: She is alert and oriented to person, place, and time.     Cranial Nerves: No cranial nerve deficit.     Deep Tendon Reflexes: Reflexes normal.  Psychiatric:        Mood and Affect: Mood is not anxious or depressed.     Wt Readings from Last 3 Encounters:  06/04/19 (!) 325 lb (147.4 kg)  01/02/19 (!) 335 lb 9.6 oz (152.2 kg)  11/27/18 (!) 339 lb (153.8 kg)    BP 108/68   Pulse (!) 108   Ht 5\' 7"  (1.702 m)   Wt (!) 325 lb (147.4 kg)   SpO2 93%   BMI 50.90 kg/m    Assessment and Plan: 1. Benign essential HTN Chronic.  Controlled.  Continue lisinopril 40 mg once a day and furosemide 40 mg once a day will check renal function panel to assess electrolytes and to evaluate GFR. - Renal function panel - lisinopril (ZESTRIL) 40 MG tablet; Take 1 tablet (40 mg total) by mouth daily.  Dispense: 90 tablet; Refill: 1 - furosemide (LASIX) 40 MG tablet; Take 1 tablet (40 mg total) by mouth daily.  Dispense: 90 tablet; Refill: 1  2. Lower extremity edema Patient has history of edema which I think is partly due to volume overload but likely also due to venous insufficiency.  Patient was started on Lasix I think from an ER setting.  We will continue this as Dr. Hollace Haywardplonk has in the past. - furosemide (LASIX) 40 MG tablet; Take 1 tablet (40 mg total) by mouth daily.  Dispense: 90 tablet; Refill: 1  3. Prediabetes She has a history of  prediabetes we will check an A1c to see level of and will check a renal function panel will not check a microalbuminuria due to that she is already on an ACE inhibitor. - Hemoglobin A1c - Renal function panel - metFORMIN (GLUCOPHAGE) 1000 MG tablet; Take 1 tablet (1,000 mg total) by mouth 2 (two) times daily with a meal.  Dispense: 180 tablet; Refill: 1  4. Musculoskeletal disease On tizanidine 4 mg once a day. - tiZANidine (ZANAFLEX) 4 MG tablet; Take 0.5 tablets (2 mg total) by mouth daily. Reported on 06/08/2016  Dispense: 90 tablet; Refill: 3  5.  GERD chronic.  Controlled.  Continue omeprazole at current dosing.

## 2019-06-05 LAB — RENAL FUNCTION PANEL
Albumin: 4.7 g/dL (ref 3.7–4.7)
BUN/Creatinine Ratio: 22 (ref 12–28)
BUN: 21 mg/dL (ref 8–27)
CO2: 23 mmol/L (ref 20–29)
Calcium: 9.9 mg/dL (ref 8.7–10.3)
Chloride: 104 mmol/L (ref 96–106)
Creatinine, Ser: 0.97 mg/dL (ref 0.57–1.00)
GFR calc Af Amer: 68 mL/min/{1.73_m2} (ref >59)
GFR calc non Af Amer: 59 mL/min/{1.73_m2} — ABNORMAL LOW (ref >59)
Glucose: 107 mg/dL — ABNORMAL HIGH (ref 65–99)
Phosphorus: 3.6 mg/dL (ref 3.0–4.3)
Potassium: 4.4 mmol/L (ref 3.5–5.2)
Sodium: 143 mmol/L (ref 134–144)

## 2019-06-05 LAB — HEMOGLOBIN A1C
Est. average glucose Bld gHb Est-mCnc: 123 mg/dL
Hgb A1c MFr Bld: 5.9 % — ABNORMAL HIGH (ref 4.8–5.6)

## 2019-06-12 DIAGNOSIS — H25013 Cortical age-related cataract, bilateral: Secondary | ICD-10-CM | POA: Diagnosis not present

## 2019-08-03 ENCOUNTER — Other Ambulatory Visit: Payer: Self-pay | Admitting: Family Medicine

## 2019-08-03 DIAGNOSIS — I10 Essential (primary) hypertension: Secondary | ICD-10-CM

## 2019-08-03 DIAGNOSIS — R600 Localized edema: Secondary | ICD-10-CM

## 2019-08-12 ENCOUNTER — Other Ambulatory Visit: Payer: Self-pay | Admitting: Family Medicine

## 2019-08-12 DIAGNOSIS — R7303 Prediabetes: Secondary | ICD-10-CM

## 2019-09-24 ENCOUNTER — Ambulatory Visit: Payer: Self-pay | Admitting: Family Medicine

## 2019-10-07 ENCOUNTER — Other Ambulatory Visit: Payer: Self-pay

## 2019-10-07 ENCOUNTER — Encounter: Payer: Self-pay | Admitting: Family Medicine

## 2019-10-07 ENCOUNTER — Ambulatory Visit: Payer: Medicare Other | Admitting: Family Medicine

## 2019-10-08 ENCOUNTER — Other Ambulatory Visit: Payer: Self-pay | Admitting: Family Medicine

## 2019-10-08 DIAGNOSIS — M799 Soft tissue disorder, unspecified: Secondary | ICD-10-CM

## 2019-10-08 DIAGNOSIS — I10 Essential (primary) hypertension: Secondary | ICD-10-CM

## 2019-11-03 ENCOUNTER — Other Ambulatory Visit: Payer: Self-pay | Admitting: Family Medicine

## 2019-11-03 DIAGNOSIS — I10 Essential (primary) hypertension: Secondary | ICD-10-CM

## 2019-11-03 DIAGNOSIS — R600 Localized edema: Secondary | ICD-10-CM

## 2019-11-10 ENCOUNTER — Other Ambulatory Visit: Payer: Self-pay | Admitting: Family Medicine

## 2019-11-10 DIAGNOSIS — R7303 Prediabetes: Secondary | ICD-10-CM

## 2019-12-27 DIAGNOSIS — Z03818 Encounter for observation for suspected exposure to other biological agents ruled out: Secondary | ICD-10-CM | POA: Diagnosis not present

## 2019-12-27 DIAGNOSIS — Z20828 Contact with and (suspected) exposure to other viral communicable diseases: Secondary | ICD-10-CM | POA: Diagnosis not present

## 2020-01-02 DIAGNOSIS — Z6841 Body Mass Index (BMI) 40.0 and over, adult: Secondary | ICD-10-CM | POA: Diagnosis not present

## 2020-01-02 DIAGNOSIS — M1990 Unspecified osteoarthritis, unspecified site: Secondary | ICD-10-CM | POA: Diagnosis not present

## 2020-01-02 DIAGNOSIS — I10 Essential (primary) hypertension: Secondary | ICD-10-CM | POA: Diagnosis not present

## 2020-01-08 ENCOUNTER — Ambulatory Visit: Payer: Medicare Other

## 2020-02-26 DIAGNOSIS — I10 Essential (primary) hypertension: Secondary | ICD-10-CM | POA: Diagnosis not present

## 2020-02-26 DIAGNOSIS — Z7189 Other specified counseling: Secondary | ICD-10-CM | POA: Diagnosis not present

## 2020-02-26 DIAGNOSIS — Z0000 Encounter for general adult medical examination without abnormal findings: Secondary | ICD-10-CM | POA: Diagnosis not present

## 2020-03-10 DIAGNOSIS — I10 Essential (primary) hypertension: Secondary | ICD-10-CM | POA: Diagnosis not present

## 2020-03-26 DIAGNOSIS — I10 Essential (primary) hypertension: Secondary | ICD-10-CM | POA: Diagnosis not present

## 2020-03-26 DIAGNOSIS — Z0000 Encounter for general adult medical examination without abnormal findings: Secondary | ICD-10-CM | POA: Diagnosis not present

## 2020-04-02 DIAGNOSIS — M1990 Unspecified osteoarthritis, unspecified site: Secondary | ICD-10-CM | POA: Diagnosis not present

## 2020-04-02 DIAGNOSIS — Z7409 Other reduced mobility: Secondary | ICD-10-CM | POA: Diagnosis not present

## 2020-04-02 DIAGNOSIS — Z6841 Body Mass Index (BMI) 40.0 and over, adult: Secondary | ICD-10-CM | POA: Diagnosis not present

## 2020-04-02 DIAGNOSIS — I10 Essential (primary) hypertension: Secondary | ICD-10-CM | POA: Diagnosis not present

## 2020-04-21 DIAGNOSIS — Z6841 Body Mass Index (BMI) 40.0 and over, adult: Secondary | ICD-10-CM | POA: Diagnosis not present

## 2020-07-02 DIAGNOSIS — I10 Essential (primary) hypertension: Secondary | ICD-10-CM | POA: Diagnosis not present

## 2020-07-02 DIAGNOSIS — M1990 Unspecified osteoarthritis, unspecified site: Secondary | ICD-10-CM | POA: Diagnosis not present

## 2020-07-02 DIAGNOSIS — Z6841 Body Mass Index (BMI) 40.0 and over, adult: Secondary | ICD-10-CM | POA: Diagnosis not present

## 2020-09-25 DIAGNOSIS — D72828 Other elevated white blood cell count: Secondary | ICD-10-CM | POA: Diagnosis not present

## 2020-09-25 DIAGNOSIS — Z1159 Encounter for screening for other viral diseases: Secondary | ICD-10-CM | POA: Diagnosis not present

## 2020-10-01 DIAGNOSIS — I10 Essential (primary) hypertension: Secondary | ICD-10-CM | POA: Diagnosis not present

## 2020-10-01 DIAGNOSIS — Z0000 Encounter for general adult medical examination without abnormal findings: Secondary | ICD-10-CM | POA: Diagnosis not present

## 2020-10-01 DIAGNOSIS — M1990 Unspecified osteoarthritis, unspecified site: Secondary | ICD-10-CM | POA: Diagnosis not present

## 2020-10-27 DIAGNOSIS — Z6841 Body Mass Index (BMI) 40.0 and over, adult: Secondary | ICD-10-CM | POA: Diagnosis not present

## 2020-10-27 DIAGNOSIS — E669 Obesity, unspecified: Secondary | ICD-10-CM | POA: Diagnosis not present

## 2023-09-04 ENCOUNTER — Emergency Department: Payer: Medicare Other

## 2023-09-04 ENCOUNTER — Emergency Department
Admission: EM | Admit: 2023-09-04 | Discharge: 2023-09-04 | Disposition: A | Discharge status: Home / Self Care | Payer: Medicare Other | Attending: Emergency Medicine | Admitting: Emergency Medicine

## 2023-09-04 ENCOUNTER — Other Ambulatory Visit: Payer: Self-pay

## 2023-09-04 DIAGNOSIS — G43909 Migraine, unspecified, not intractable, without status migrainosus: Secondary | ICD-10-CM | POA: Insufficient documentation

## 2023-09-04 DIAGNOSIS — R519 Headache, unspecified: Secondary | ICD-10-CM | POA: Insufficient documentation

## 2023-09-04 HISTORY — DX: Polyneuropathy, unspecified: G62.9

## 2023-09-04 LAB — BASIC METABOLIC PANEL
Anion gap: 10 (ref 5–15)
BUN: 26 mg/dL — ABNORMAL HIGH (ref 8–23)
CO2: 26 mmol/L (ref 22–32)
Calcium: 9.5 mg/dL (ref 8.9–10.3)
Chloride: 103 mmol/L (ref 98–111)
Creatinine, Ser: 0.65 mg/dL (ref 0.44–1.00)
GFR, Estimated: >60 mL/min (ref >60)
Glucose, Bld: 122 mg/dL — ABNORMAL HIGH (ref 70–99)
Potassium: 3.9 mmol/L (ref 3.5–5.1)
Sodium: 139 mmol/L (ref 135–145)

## 2023-09-04 LAB — CBC WITH DIFFERENTIAL/PLATELET
Abs Immature Granulocytes: 0.04 10*3/uL (ref 0.00–0.07)
Basophils Absolute: 0.1 10*3/uL (ref 0.0–0.1)
Basophils Relative: 1 %
Eosinophils Absolute: 0.1 10*3/uL (ref 0.0–0.5)
Eosinophils Relative: 1 %
HCT: 48.2 % — ABNORMAL HIGH (ref 36.0–46.0)
Hemoglobin: 15.3 g/dL — ABNORMAL HIGH (ref 12.0–15.0)
Immature Granulocytes: 0 %
Lymphocytes Relative: 19 %
Lymphs Abs: 1.7 10*3/uL (ref 0.7–4.0)
MCH: 29.1 pg (ref 26.0–34.0)
MCHC: 31.7 g/dL (ref 30.0–36.0)
MCV: 91.8 fL (ref 80.0–100.0)
Monocytes Absolute: 0.8 10*3/uL (ref 0.1–1.0)
Monocytes Relative: 8 %
Neutro Abs: 6.4 10*3/uL (ref 1.7–7.7)
Neutrophils Relative %: 71 %
Platelets: 256 10*3/uL (ref 150–400)
RBC: 5.25 MIL/uL — ABNORMAL HIGH (ref 3.87–5.11)
RDW: 13.1 % (ref 11.5–15.5)
WBC: 9 10*3/uL (ref 4.0–10.5)
nRBC: 0 % (ref 0.0–0.2)

## 2023-09-04 MED ORDER — ONDANSETRON 4 MG PO TBDP: 4.0000 mg | ORAL_TABLET | Freq: Three times a day (TID) | ORAL | 0 refills | Status: DC | PRN

## 2023-09-04 MED ORDER — SODIUM CHLORIDE 0.9 % IV SOLN: Freq: Once | INTRAVENOUS | Status: AC

## 2023-09-04 MED ORDER — ONDANSETRON HCL 4 MG/2ML IJ SOLN
4.0000 mg | Freq: Once | INTRAMUSCULAR | Status: AC
  Administered 2023-09-04: 4 mg via INTRAVENOUS
  Filled 2023-09-04: qty 2

## 2023-09-04 MED ORDER — OXYCODONE-ACETAMINOPHEN 5-325 MG PO TABS: 1.0000 | ORAL_TABLET | ORAL | Status: DC | PRN

## 2023-09-04 MED ORDER — KETOROLAC TROMETHAMINE 15 MG/ML IJ SOLN
15.0000 mg | Freq: Once | INTRAMUSCULAR | Status: AC
  Administered 2023-09-04: 15 mg via INTRAVENOUS
  Filled 2023-09-04: qty 1

## 2023-09-04 MED ORDER — POTASSIUM CHLORIDE CRYS ER 20 MEQ PO TBCR
20.0000 meq | EXTENDED_RELEASE_TABLET | Freq: Once | ORAL | Status: AC
  Administered 2023-09-04: 20 meq via ORAL
  Filled 2023-09-04: qty 1

## 2023-09-04 MED ORDER — LABETALOL HCL 5 MG/ML IV SOLN: 10.0000 mg | Freq: Once | INTRAVENOUS | Status: DC

## 2023-09-04 NOTE — ED Provider Notes (Signed)
[facility] Provider Note    Event Date/Time   First MD Initiated Contact with Patient 09/04/23 0715     (approximate)   History   Migraine   HPI  Lydia Beard is a 77 y.o. female with history of hypertension, peripheral neuropathy, limited mobility who presents with complaints of left-sided headache which she describes as been present for over a day.  She describes nausea as well.  Distant history of migraines but more recently they have been quite intermittent.  No neurodeficits.  No changes in vision.     Physical Exam   Triage Vital Signs: ED Triage Vitals  Encounter Vitals Group     BP 09/04/23 0538 (!) 210/100     Systolic BP Percentile --      Diastolic BP Percentile --      Pulse Rate 09/04/23 0538 88     Resp 09/04/23 0538 15     Temp 09/04/23 0538 97.7 F (36.5 C)     Temp src --      SpO2 09/04/23 0538 95 %     Weight 09/04/23 0530 (!) 158.8 kg (350 lb)     Height 09/04/23 0530 1.689 m (5' 6.5")     Head Circumference --      Peak Flow --      Pain Score 09/04/23 0529 8     Pain Loc --      Pain Education --      Exclude from Growth Chart --     Most recent vital signs: Vitals:   09/04/23 0538 09/04/23 0909  BP: (!) 210/100 (!) 157/73  Pulse: 88 84  Resp: 15 16  Temp: 97.7 F (36.5 C)   SpO2: 95% 97%     General: Awake, no distress.  CV:  Good peripheral perfusion.  Resp:  Normal effort.  Abd:  No distention.  Other:  Cranial nerves normal   ED Results / Procedures / Treatments   Labs (all labs ordered are listed, but only abnormal results are displayed) Labs Reviewed  BASIC METABOLIC PANEL - Abnormal; Notable for the following components:      Result Value   Glucose, Bld 122 (*)    BUN 26 (*)    All other components within normal limits  CBC WITH DIFFERENTIAL/PLATELET - Abnormal; Notable for the following components:   RBC 5.25 (*)    Hemoglobin 15.3 (*)    HCT 48.2 (*)    All other components  within normal limits     EKG     RADIOLOGY     PROCEDURES:  Critical Care performed:   Procedures   MEDICATIONS ORDERED IN ED: Medications  potassium chloride SA (KLOR-CON M) CR tablet 20 mEq (has no administration in time range)  0.9 %  sodium chloride infusion ( Intravenous New Bag/Given 09/04/23 0823)  ondansetron (ZOFRAN) injection 4 mg (4 mg Intravenous Given 09/04/23 0823)  ketorolac (TORADOL) 15 MG/ML injection 15 mg (15 mg Intravenous Given 09/04/23 0824)     IMPRESSION / MDM / ASSESSMENT AND PLAN / ED COURSE  I reviewed the triage vital signs and the nursing notes. Patient's presentation is most consistent with acute presentation with potential threat to life or bodily function.  Patient presents with headache as detailed above, differential includes COVID, general headache, migraine, less likely ICH/stroke  CT head is reassuring, no acute abnormalities noted by radiology  Work reviewed and is generally unremarkable.  Will treat with IV fluids, IV nausea medication,  low-dose IV Toradol and reevaluate.  Patient feeling better after treatment, no indication for admission at this time, she would like to go home and sleep, I think this is appropriate, return precautions discussed.      FINAL CLINICAL IMPRESSION(S) / ED DIAGNOSES   Final diagnoses:  Acute nonintractable headache, unspecified headache type     Rx / DC Orders   ED Discharge Orders          Ordered    ondansetron (ZOFRAN-ODT) 4 MG disintegrating tablet  Every 8 hours PRN        09/04/23 0943             Note:  This document was prepared using Dragon voice recognition software and may include unintentional dictation errors.   Jene Every, MD 09/04/23 [phone]

## 2023-09-04 NOTE — ED Triage Notes (Signed)
Pt reports headache in left anterior portion of head and in temple area along with intermittently behind left eye. Pain also in teeth and in jaw. Hx of migraines (has not had them in years). Pain began a couple hours ago suddenly. Pt reports dizziness for the past several days and admits to nausea without vomiting. Pt has photophobia but denies sensitivity to sound.

## 2023-09-04 NOTE — ED Triage Notes (Signed)
Pt reports stiff neck, denies fever.

## 2023-09-04 NOTE — ED Notes (Signed)
First Nurse note: pt BIB ems from home with c/o left side temporal HA with pain radiating into jaw.  Pt sensitive to light per ems.  Hx of migraines, and has not been taking meds.   196/98 94 RA  87 HR  97.5 125CBG

## 2023-09-04 NOTE — ED Notes (Signed)
Blood sent to the lab at this time. (Blue top, green top, purple top)

## 2023-09-04 NOTE — ED Notes (Signed)
To bathroom via wheel chair to void.  Then got on stretcher for IV.

## 2024-04-08 ENCOUNTER — Emergency Department

## 2024-04-08 ENCOUNTER — Emergency Department
Admission: EM | Admit: 2024-04-08 | Discharge: 2024-04-09 | Disposition: A | Discharge status: Home / Self Care | Attending: Emergency Medicine | Admitting: Emergency Medicine

## 2024-04-08 ENCOUNTER — Other Ambulatory Visit: Payer: Self-pay

## 2024-04-08 DIAGNOSIS — I10 Essential (primary) hypertension: Secondary | ICD-10-CM | POA: Diagnosis not present

## 2024-04-08 DIAGNOSIS — L03115 Cellulitis of right lower limb: Secondary | ICD-10-CM | POA: Diagnosis not present

## 2024-04-08 DIAGNOSIS — M79661 Pain in right lower leg: Secondary | ICD-10-CM | POA: Diagnosis present

## 2024-04-08 LAB — CBC
HCT: 42.9 % (ref 36.0–46.0)
Hemoglobin: 13.7 g/dL (ref 12.0–15.0)
MCH: 29.3 pg (ref 26.0–34.0)
MCHC: 31.9 g/dL (ref 30.0–36.0)
MCV: 91.9 fL (ref 80.0–100.0)
Platelets: 221 10*3/uL (ref 150–400)
RBC: 4.67 MIL/uL (ref 3.87–5.11)
RDW: 13.6 % (ref 11.5–15.5)
WBC: 10.8 10*3/uL — ABNORMAL HIGH (ref 4.0–10.5)
nRBC: 0 % (ref 0.0–0.2)

## 2024-04-08 LAB — BASIC METABOLIC PANEL WITH GFR
Anion gap: 11 (ref 5–15)
BUN: 27 mg/dL — ABNORMAL HIGH (ref 8–23)
CO2: 26 mmol/L (ref 22–32)
Calcium: 9 mg/dL (ref 8.9–10.3)
Chloride: 102 mmol/L (ref 98–111)
Creatinine, Ser: 0.85 mg/dL (ref 0.44–1.00)
GFR, Estimated: >60 mL/min (ref >60)
Glucose, Bld: 117 mg/dL — ABNORMAL HIGH (ref 70–99)
Potassium: 3.8 mmol/L (ref 3.5–5.1)
Sodium: 139 mmol/L (ref 135–145)

## 2024-04-08 LAB — LACTIC ACID, PLASMA: Lactic Acid, Venous: 1.8 mmol/L (ref 0.5–1.9)

## 2024-04-08 MED ORDER — HYDROMORPHONE HCL 1 MG/ML IJ SOLN
0.5000 mg | Freq: Once | INTRAMUSCULAR | Status: AC
  Administered 2024-04-08: 0.5 mg via INTRAVENOUS
  Filled 2024-04-08: qty 0.5

## 2024-04-08 MED ORDER — ONDANSETRON HCL 4 MG/2ML IJ SOLN
4.0000 mg | Freq: Once | INTRAMUSCULAR | Status: AC
  Administered 2024-04-08: 4 mg via INTRAVENOUS
  Filled 2024-04-08: qty 2

## 2024-04-08 MED ORDER — HYDROCODONE-ACETAMINOPHEN 5-325 MG PO TABS
1.0000 | ORAL_TABLET | ORAL | 0 refills | Status: DC | PRN
Start: 2024-04-08 — End: 2024-10-14

## 2024-04-08 MED ORDER — CLINDAMYCIN HCL 300 MG PO CAPS: 300.0000 mg | ORAL_CAPSULE | Freq: Three times a day (TID) | ORAL | 0 refills | Status: AC

## 2024-04-08 MED ORDER — CLINDAMYCIN PHOSPHATE 600 MG/50ML IV SOLN
600.0000 mg | Freq: Once | INTRAVENOUS | Status: AC
  Administered 2024-04-08: 600 mg via INTRAVENOUS
  Filled 2024-04-08: qty 50

## 2024-04-08 NOTE — ED Triage Notes (Signed)
 Pt comes with c/o bilateral leg pain, swelling and redness. Pt states she noticed it yesterday. Pt states more swelling to right leg than leg. Pt has noticeable redness up her leg.

## 2024-04-08 NOTE — ED Provider Notes (Signed)
 [facility] Provider Note    Event Date/Time   First MD Initiated Contact with Patient 04/08/24 1604     (approximate)  History   Chief Complaint: cellulitis and Leg Pain  HPI  Lydia Beard is a 78 y.o. female with a past medical history of arthritis, gastric reflux, hypertension, obesity, peripheral edema/lymphedema, presents to the emergency department for worsening discomfort and redness in her right lower extremity.  Patient has a history of significant lymphedema at baseline states she will often have weepage from the legs.  She noted yesterday that she felt some discomfort in the right leg and today noted that it was red and more tender to the touch.  Patient has a history of obesity and chronic lymphedema has trouble getting in and out of her house, but called EMS and they were able to get her here to evaluate.  Patient denies any fever.  Patient does take Lasix  but only 20 mg daily.  Physical Exam   Triage Vital Signs: ED Triage Vitals  Encounter Vitals Group     BP 04/08/24 1052 (!) 177/95     Systolic BP Percentile --      Diastolic BP Percentile --      Pulse Rate 04/08/24 1052 83     Resp 04/08/24 1052 20     Temp 04/08/24 1052 98 F (36.7 C)     Temp src --      SpO2 04/08/24 1052 95 %     Weight --      Height --      Head Circumference --      Peak Flow --      Pain Score 04/08/24 1051 10     Pain Loc --      Pain Education --      Exclude from Growth Chart --     Most recent vital signs: Vitals:   04/08/24 1052  BP: (!) 177/95  Pulse: 83  Resp: 20  Temp: 98 F (36.7 C)  SpO2: 95%    General: Awake, no distress.  CV:  Good peripheral perfusion.  Regular rate and rhythm  Resp:  Normal effort.  Equal breath sounds bilaterally.  Abd:  No distention.  Soft, nontender.  No rebound or guarding. Other:  Patient has chronic lymphedema in the lower extremities but she has erythema from the right mid calf extending proximately  to the right mid thigh.  Tender to this area as well.  Mild weepage of bilateral lower extremities.   ED Results / Procedures / Treatments   RADIOLOGY  Ultrasound negative for DVT   MEDICATIONS ORDERED IN ED: Medications  clindamycin  (CLEOCIN ) IVPB 600 mg (has no administration in time range)     IMPRESSION / MDM / ASSESSMENT AND PLAN / ED COURSE  I reviewed the triage vital signs and the nursing notes.  Patient's presentation is most consistent with acute presentation with potential threat to life or bodily function.  Patient presents to the emergency department for increased redness and discomfort of her right lower extremity.  Patient has a history of chronic lymphedema however she does appear to have erythema of the right leg concerning more for possible cellulitis in addition to the lymphedema.  Reassuringly patient's labs shows a lactate of 1.8, white blood cell count of 10.8, chemistry reassuring.  Will obtain an ultrasound to help rule out DVT although appears to be more clinically consistent with cellulitis.  We will dose IV clindamycin  given the  patient's penicillin allergy while awaiting ultrasound results.  Lab work is reassuring.  Patient's ultrasound shows no obvious sign of DVT.  Will place patient on clindamycin  3 times daily x 10 days have the patient follow-up with her doctor.  FINAL CLINICAL IMPRESSION(S) / ED DIAGNOSES   cellulitis   Note:  This document was prepared using Dragon voice recognition software and may include unintentional dictation errors.   Ruth Cove, MD 04/08/24 2239

## 2024-04-08 NOTE — Discharge Instructions (Signed)
 Begin taking antibiotic and take 3 times daily for its entire course.  For the next 7 days please take 40 mg of Lasix  each day (opposed to 20 mg).  Ideally we would like you to follow-up with your primary care doctor within the next 1 week for recheck.  Return to the emergency department for any worsening pain or fever or any other symptom personally concerning to yourself.  Please attempt to keep your legs elevated when you are able.

## 2024-04-08 NOTE — ED Triage Notes (Signed)
 Arrives from home via [facility].  C/O legs weeping since March. Right lower leg more sensitive this morning.  Right lower leg swelling and red, hot to touch.  VS wnl  Allergic to PCN.

## 2024-06-30 NOTE — Telephone Encounter (Signed)
-------------------------------------------------------------------------------   Summary: [facility] -------------------------------------------------------------------------------  [facility] home health for the areas of [place], [place], and [place] (and surrounding areas) have been at capacity. We will have availability to see your patient on the date listed below.  If you agree with this Start of Care date (07/03/2024) and the patient is safe to wait until 07/03/2024 please advise us . After receiving your approval, we will re-transcribe the order to reflect the approved start of care date.   We also would like to confirm that you will follow the patient and sign the plan of care for this home health episode. A response to this secure chat/telephone call will suffice as a verified order to proceed/confirmation of the above.  If you have any questions, please contact me with the information provided below or respond here.  In addition, you can call our [facility] Intake department at [phone] and ask to speak with one of the nurses.     PLEASE NOTE: If at all possible and slots become available, we will try to see the patient before this date.   Thank you for your consideration.     Respectfully,  Clotilda Rash, RN [facility] Intake

## 2024-07-22 NOTE — Telephone Encounter (Addendum)
 States she does not have dependable transportation and is home bound.  Needs referrals changed to in-home visits.   Home Health order was closed by [facility] services.  Needs new orders.  Check orders teed up and ready to sign.  Ramsie states she was told Lymphedema needs to be part of the home health referral so added lymphedema therapy to home health order.  Changed to Urgent. (See note today about blood clot and cellulitis.)

## 2024-07-30 NOTE — Progress Notes (Signed)
 This auto-generated note displays some results identified during the AWV Assessments. For full results, please see the Flowsheet Links under the Additional Documentation section of this encounter in Chart Review.     The patient reports they are physically located in [place]  and is currently: at home. I conducted a phone visit.  I spent 54 minutes on the phone call with the patient on the date of service .  Location of Patient: at home   Action within 48 hours High priority, please respond within 2 business days.   I am reaching out to [facility] as part of the embedded care management team regarding her Annual Medicare Wellness visit.  Patient is at risk for ADL/IADL Limitations, BMI Abnormal and/or patient would benefit from meeting with an RD for Chronic Condition Management, Dental issues, Depression , Falls Risk, Pain, Physical Activity Limitations, Psychosocial Risks, Self-health, and Tired/fatigued.  Action item: sign off on order and make recommendations Initial recommendations: Patient reports she needs to be re-referred to IN-HOME Lymphedema Care as well as Home Health. She reports prior referrals were closed.  Also, due to patient's significant transportation problems and barriers to accessing medical care, wondering if consideration of a referral to [facility] would be beneficial. PHQ-9 was 12 - please ensure patient has follow up on mood within 3 months with PCP. I did send information about [facility] a counseling agency.  Please advise for additional recommendations.  Next appointment with PCP: 09/24/24  Patient was unable to report the following vital signs today due to visit being performed by telehealth and patient lack of required equipment to obtain: Weight, Height, Blood Pressure , Pulse, and Temperature. Please see flowsheets for any vital signs that were reported this visit.  Patient reported Vital Signs: Pain  See chart for med list if  needed  Rumalda LITTIE Schirmer, LCSW    Social Determinants of Health: Social Determinants of Health Screened today.  Interventions Provided: I provided an intervention for the Physical Activity, Social Connections, Stress, and Transportation Needs SDOH domain. The intervention was Provided [employer] and Education  PCP notified of above risks by Routing encounter  The following list of current providers and suppliers reviewed and updated this visit. Patient Care Team: Geralene Levorn Geralds, FNP as PCP - General (Family Medicine) Camelia Sherwood Sieving, NP as PCP - Deberah Hasting, Lynwood HERO, OD as Consulting Physician [facility]) Referring, Unknown Per Patient (Dentistry)  Medications and supplements were reviewed and updated this visit. See medication list in encounter summary.   A personalized prevent plan was updated and reviewed with the patient. A copy has been provided to the patient in Patient Instructions.  Recent Hospitalizations reviewed: No recent hospitalizations   General Health: Patient answered (!) Poor to self health assessment wellness information provided in AVS  Patient answered (!) Poor to oral health assessment oral care information provided in AVS and transportation resources provided  Patient answered (!) No to having visited the dentist in the past year.     Patient's BMI is 57.52 Patient answered No to nutrition services referral.    Pain identified during today's visit      07/30/24 1122  PainSc: 7    Chronic Pain Information Provided in AVS and PCP notified     Safety:  The patient answered No to falling in the last year, and (!) Yes to feeling unsteady while standing or walking. Falls Assessment Review  Fall Risk Assessment was positive. Falls risk interventions taken today were: Discussed  interventions in home e.g. Shower chairs, grab bars, no loose rugs in the home Medications were reviewed DEXA scan offered but  patient declined Fall prevention education in AVS   Patient answered that they had difficult or needed help with the following Everyday Tasks: (!) Getting in or out of the tub/shower, Bathing None of the above PCP notified and pt requested home health referral     Psychosocial Assessment:  Patient answered one or more psychosocial assessments abnormally Do you feel overwhelmed as a caregiver? Not Applicable  Do you express feelings of anger and frustration in ways that are hurtful to yourself or others? Never or Almost Never   Do you feel generally tired or fatigue? (!) Always   Place [facility] Resource in AWV and Inform PCP    PHQ-2 Score: 6 PHQ-9 Score: 12  PHQ score 10-14 indicating moderate depression.  Due to positive screen, offered referral to counseling, patient amenable to information about [facility]. Additionally, patient scheduled with PCP for follow-up of depression symptoms within the next 3 months.    Screening complete, depression identified / today's follow-up action documented in note   Social Drivers of Health   Food Insecurity: No Food Insecurity (07/30/2024)   Hunger Vital Sign   . Worried About Programme researcher, broadcasting/film/video in the Last Year: Never true   . Ran Out of Food in the Last Year: Never true  Tobacco Use: Medium Risk (07/30/2024)   Patient History   . Smoking Tobacco Use: Former   . Smokeless Tobacco Use: Never   . Passive Exposure: Not on file  Transportation Needs: Unmet Transportation Needs (07/30/2024)   PRAPARE - Transportation   . Lack of Transportation (Medical): Yes   . Lack of Transportation (Non-Medical): Yes  Alcohol Use: Not At Risk (07/30/2024)   Alcohol Use   . How often do you have a drink containing alcohol?: Monthly or less   . How many drinks containing alcohol do you have on a typical day when you are drinking?: 1 - 2   . How often do you have 5 or more drinks on one occasion?: Never  Housing: Low Risk   (07/30/2024)   Housing   . Within the past 12 months, have you ever stayed: outside, in a car, in a tent, in an overnight shelter, or temporarily in someone else's home (i.e. couch-surfing)?: No   . Are you worried about losing your housing?: No  Physical Activity: Inactive (07/30/2024)   Exercise Vital Sign   . Days of Exercise per Week: 0 days   . Minutes of Exercise per Session: 0 min  Utilities: Low Risk  (07/30/2024)   Utilities   . Within the past 12 months, have you been unable to get utilities (heat, electricity) when it was really needed?: No  Stress: Stress Concern Present (07/30/2024)   [employer] of Occupational Health - Occupational Stress Questionnaire   . Feeling of Stress: To some extent  Interpersonal Safety: Not At Risk (07/30/2024)   Interpersonal Safety   . Unsafe Where You Currently Live: No   . Physically Hurt by Anyone: No   . Abused by Anyone: No  Substance Use: Low Risk  (07/30/2024)   Substance Use   . In the past year, how often have you used prescription drugs for non-medical reasons?: Never   . In the past year, how often have you used illegal drugs?: Never   . In the past year, have you used any substance for  non-medical reasons?: No  Intimate Partner Violence: Not At Risk (07/30/2024)   Humiliation, Afraid, Rape, and Kick questionnaire   . Fear of Current or Ex-Partner: No   . Emotionally Abused: No   . Physically Abused: No   . Sexually Abused: No  Social Connections: Socially Isolated (07/30/2024)   Social Connection and Isolation Panel   . Frequency of Communication with Friends and Family: More than three times a week   . Frequency of Social Gatherings with Friends and Family: More than three times a week   . Attends Religious Services: Never   . Active Member of Clubs or Organizations: No   . Attends Banker Meetings: Never   . Marital Status: Widowed  Physicist, medical Strain: Low Risk  (07/30/2024)   Overall Financial Resource  Strain (CARDIA)   . Difficulty of Paying Living Expenses: Not very hard  Health Literacy: Low Risk  (07/30/2024)   Health Literacy   . : Never  Internet Connectivity: No Internet connectivity concern identified (07/30/2024)   Internet Connectivity   . Do you have access to internet services: Yes   . How do you connect to the internet: Personal Device at home   . Is your internet connection strong enough for you to watch video on your device without major problems?: Yes   . Do you have enough data to get through the month?: Yes   . Does at least one of the devices have a camera that you can use for video chat?: Yes

## 2024-08-01 NOTE — Progress Notes (Signed)
  COMMUNITY HEALTH WORKER Outreach Note  08/01/2024 Inform/Regulatory Low priority, no response needed unless change in care.   I am reaching out to [employer] as part of the community health worker team regarding her [employer].  Patient advised to [facility] with patient confirmed there are transportation barrier referral placed for ACO transportation patient is a Value Care patient with the ACO banner .   Sharing communication as part of regulatory requirements.   Referral:  Referral received from: Levorn Waddell Snide  Reason for referral: Resource Coordination  Assessment:  Patients Primary Concern: Transportation barriers  Patient Strengths: Self-advocacy  Additional Background Information provided by patient: No additional background given  Food Insecurity: No Food Insecurity (07/30/2024)   Hunger Vital Sign   . Worried About Programme researcher, broadcasting/film/video in the Last Year: Never true   . Ran Out of Food in the Last Year: Never true   Transportation Needs: Unmet Transportation Needs (07/30/2024)   PRAPARE - Transportation   . Lack of Transportation (Medical): Yes   . Lack of Transportation (Non-Medical): Yes   Financial Resource Strain: Low Risk  (07/30/2024)   Overall Financial Resource Strain (CARDIA)   . Difficulty of Paying Living Expenses: Not very hard   Housing: Low Risk  (07/30/2024)   Health Literacy: Low Risk  (07/30/2024)   Health Literacy   . : Never    Social Connections: Socially Isolated (07/30/2024)   Social Connection and Isolation Panel   . Frequency of Communication with Friends and Family: More than three times a week   . Frequency of Social Gatherings with Friends and Family: More than three times a week   . Attends Religious Services: Never   . Active Member of Clubs or Organizations: No   . Attends Banker Meetings: Never   . Marital Status: Widowed      I provided an intervention for the Transportation Needs SDOH domain.  The intervention was Provided community resources   Patient expressed understanding.  Next Follow-up: No additional services needed  Note Routed: No.  Routed to:N/A  Signed: Natalie N Clifton

## 2024-08-01 NOTE — Telephone Encounter (Addendum)
 Still has not heard from home health.  Looks like order was closed.  Due to lack of transportation needs new referral for home health for her lymphedema.  LT leg is worse.  Burning last night and couldn't sleep due to the pain.  Redness, swelling, weeping and pain in both legs.  Left is worse.  [name] aware she needs to be evaluated.     Needs 2 referrals:  Lymphedema therapy and Home Health.  Must be in her home.  See note last referral.  07/06/2024  1:32 PM Bass-Henry, Kenney, LPN Home health -  Note: See tele call sent to provider

## 2024-08-02 ENCOUNTER — Emergency Department

## 2024-08-02 ENCOUNTER — Other Ambulatory Visit: Payer: Self-pay

## 2024-08-02 ENCOUNTER — Emergency Department: Admission: EM | Admit: 2024-08-02 | Discharge: 2024-08-02 | Disposition: A | Discharge status: Home / Self Care

## 2024-08-02 DIAGNOSIS — L03119 Cellulitis of unspecified part of limb: Secondary | ICD-10-CM

## 2024-08-02 DIAGNOSIS — I10 Essential (primary) hypertension: Secondary | ICD-10-CM | POA: Insufficient documentation

## 2024-08-02 DIAGNOSIS — L03116 Cellulitis of left lower limb: Secondary | ICD-10-CM | POA: Insufficient documentation

## 2024-08-02 DIAGNOSIS — M79604 Pain in right leg: Secondary | ICD-10-CM

## 2024-08-02 DIAGNOSIS — M7989 Other specified soft tissue disorders: Secondary | ICD-10-CM | POA: Diagnosis present

## 2024-08-02 DIAGNOSIS — I82412 Acute embolism and thrombosis of left femoral vein: Secondary | ICD-10-CM | POA: Insufficient documentation

## 2024-08-02 LAB — CBC
HCT: 43.7 % (ref 36.0–46.0)
Hemoglobin: 14.2 g/dL (ref 12.0–15.0)
MCH: 29.8 pg (ref 26.0–34.0)
MCHC: 32.5 g/dL (ref 30.0–36.0)
MCV: 91.8 fL (ref 80.0–100.0)
Platelets: 245 K/uL (ref 150–400)
RBC: 4.76 MIL/uL (ref 3.87–5.11)
RDW: 13.5 % (ref 11.5–15.5)
WBC: 10.7 K/uL — ABNORMAL HIGH (ref 4.0–10.5)
nRBC: 0 % (ref 0.0–0.2)

## 2024-08-02 LAB — LACTIC ACID, PLASMA
Lactic Acid, Venous: 2.4 mmol/L (ref 0.5–1.9)
Lactic Acid, Venous: 1.8 mmol/L (ref 0.5–1.9)

## 2024-08-02 LAB — COMPREHENSIVE METABOLIC PANEL WITH GFR
ALT: 19 U/L (ref 0–44)
AST: 27 U/L (ref 15–41)
Albumin: 3.9 g/dL (ref 3.5–5.0)
Alkaline Phosphatase: 89 U/L (ref 38–126)
Anion gap: 10 (ref 5–15)
BUN: 31 mg/dL — ABNORMAL HIGH (ref 8–23)
CO2: 24 mmol/L (ref 22–32)
Calcium: 9.5 mg/dL (ref 8.9–10.3)
Chloride: 107 mmol/L (ref 98–111)
Creatinine, Ser: 0.77 mg/dL (ref 0.44–1.00)
GFR, Estimated: >60 mL/min (ref >60)
Glucose, Bld: 95 mg/dL (ref 70–99)
Potassium: 3.9 mmol/L (ref 3.5–5.1)
Sodium: 141 mmol/L (ref 135–145)
Total Bilirubin: 0.9 mg/dL (ref 0.0–1.2)
Total Protein: 7.9 g/dL (ref 6.5–8.1)

## 2024-08-02 MED ORDER — IOHEXOL 350 MG/ML SOLN
100.0000 mL | Freq: Once | INTRAVENOUS | Status: AC | PRN
  Administered 2024-08-02: 100 mL via INTRAVENOUS

## 2024-08-02 MED ORDER — OXYCODONE-ACETAMINOPHEN 5-325 MG PO TABS: 1.0000 | ORAL_TABLET | ORAL | 0 refills | Status: AC | PRN

## 2024-08-02 MED ORDER — RIVAROXABAN (XARELTO) EDUCATION KIT FOR DVT/PE PATIENTS
PACK | Freq: Once | Status: DC
  Filled 2024-08-02: qty 1

## 2024-08-02 MED ORDER — SODIUM CHLORIDE 0.9 % IV BOLUS
500.0000 mL | Freq: Once | INTRAVENOUS | Status: AC
  Administered 2024-08-02: 500 mL via INTRAVENOUS

## 2024-08-02 MED ORDER — RIVAROXABAN (XARELTO) VTE STARTER PACK (15 & 20 MG)
ORAL_TABLET | ORAL | 0 refills | Status: DC
Start: 2024-08-02 — End: 2024-08-03

## 2024-08-02 MED ORDER — RIVAROXABAN 15 MG PO TABS
15.0000 mg | ORAL_TABLET | Freq: Once | ORAL | Status: AC
  Administered 2024-08-02: 15 mg via ORAL
  Filled 2024-08-02: qty 1

## 2024-08-02 MED ORDER — DOXYCYCLINE HYCLATE 100 MG PO TABS: 100.0000 mg | ORAL_TABLET | Freq: Two times a day (BID) | ORAL | 0 refills | Status: AC

## 2024-08-02 MED ORDER — DOXYCYCLINE HYCLATE 100 MG PO TABS
100.0000 mg | ORAL_TABLET | Freq: Once | ORAL | Status: AC
  Administered 2024-08-02: 100 mg via ORAL
  Filled 2024-08-02: qty 1

## 2024-08-02 MED ORDER — OXYCODONE-ACETAMINOPHEN 5-325 MG PO TABS
1.0000 | ORAL_TABLET | Freq: Once | ORAL | Status: AC
  Administered 2024-08-02: 1 via ORAL
  Filled 2024-08-02: qty 1

## 2024-08-02 NOTE — ED Notes (Signed)
 Lactic 2.4 MD Ernest informed

## 2024-08-02 NOTE — ED Notes (Signed)
 Patient requested to transition to the chair at the bedside. Patient was assisted to the same without incident.

## 2024-08-02 NOTE — ED Triage Notes (Signed)
 Pt brought to the ER for ongoing bilat lower leg swelling but in the past few days pt has been having pain and redness to bilat lower extrem

## 2024-08-02 NOTE — Discharge Instructions (Signed)
 You were seen in the emergency department for several days of lower extremity erythema pain and edema.  This was consistent with a cellulitis and also you had a left deep venous thrombus.  Please initiate your blood thinner medication.  Be aware that this may make you at increased risk for bleeding in the brain should you fall.  Only use your pain medication if you are sitting down as this can make you fall as well.  Please have a repeat ultrasound of your leg completed in 2 to 3 weeks.  Please call your primary care physician on Monday and arrange a urgent follow-up next week.  Return if any acutely worsening symptoms or any other emergency.  It was very nice meeting you and I wish you the best of luck! -- RETURN PRECAUTIONS & AFTERCARE: (ENGLISH) RETURN PRECAUTIONS: Return immediately to the emergency department or see/call your doctor if you feel worse, weak or have changes in speech or vision, are short of breath, have fever, vomiting, pain, bleeding or dark stool, trouble urinating or any new issues. Return here or see/call your doctor if not improving as expected for your suspected condition. FOLLOW-UP CARE: Call your doctor and/or any doctors we referred you to for more advice and to make an appointment. Do this today, tomorrow or after the weekend. Some doctors only take PPO insurance so if you have HMO insurance you may want to contact your HMO or your regular doctor for referral to a specialist within your plan. Either way tell the doctor's office that it was a referral from the emergency department so you get the soonest possible appointment.  YOUR TEST RESULTS: Take result reports of any blood or urine tests, imaging tests and EKG's to your doctor and any referral doctor. Have any abnormal tests repeated. Your doctor or a referral doctor can let you know when this should be done. Also make sure your doctor contacts this hospital to get any test results that are not currently available such as  cultures or special tests for infection and final imaging reports, which are often not available at the time you leave the ER but which may list additional important findings that are not documented on the preliminary report. BLOOD PRESSURE: If your blood pressure was greater than 120/80 have your blood pressure rechecked within 1 to 2 weeks. MEDICATION SIDE EFFECTS: Do not drive, walk, bike, take the bus, etc. if you have received or are being prescribed any sedating medications such as those for pain or anxiety or certain antihistamines like Benadryl. If you have been give one of these here get a taxi home or have a friend drive you home. Ask your pharmacist to counsel you on potential side effects of any new medication

## 2024-08-02 NOTE — ED Notes (Signed)
 Patient assisted to toilet in room and back to recliner without incident. Lab in for blood draw at this time.

## 2024-08-02 NOTE — ED Triage Notes (Signed)
 Pt comes via EMs from home with pitting edema on right leg and cellulitis on left leg. Pt states it has gotten worse over the last three days. 183/104 90 98% [employer] 2L, pt now off 91.9 temp CBG 116

## 2024-08-02 NOTE — ED Provider Notes (Signed)
 [facility] Provider Note    Event Date/Time   First MD Initiated Contact with Patient 08/02/24 1608     (approximate)   History   Leg Swelling   HPI  Lydia Beard is a 78 y.o. female  past medical history of arthritis, gastric reflux, hypertension, obesity, peripheral edema/lymphedema who presents to the emergency department with 2 weeks of bilateral lower extremity edema erythema with left worse than right.  Patient reports a long history of lymphedema and has been treated previously for cellulitis of the legs.  She reports that this is consistent with such.  She has intermittently had shortness of breath but denies any chest pain or shortness of breath at rest.  She reports compliance of all of her medications and does follow regularly with her primary care physician.  She has no prior history of VTE.      Physical Exam   Triage Vital Signs: ED Triage Vitals  Encounter Vitals Group     BP 08/02/24 1316 (!) 153/70     Girls Systolic BP Percentile --      Girls Diastolic BP Percentile --      Boys Systolic BP Percentile --      Boys Diastolic BP Percentile --      Pulse Rate 08/02/24 1316 86     Resp 08/02/24 1316 16     Temp 08/02/24 1316 99.1 F (37.3 C)     Temp Source 08/02/24 1316 Oral     SpO2 08/02/24 1316 94 %     Weight 08/02/24 1317 (!) 356 lb (161.5 kg)     Height 08/02/24 1317 5' 6 (1.676 m)     Head Circumference --      Peak Flow --      Pain Score 08/02/24 1317 8     Pain Loc --      Pain Education --      Exclude from Growth Chart --     Most recent vital signs: Vitals:   08/02/24 2050 08/02/24 2130  BP: (!) 165/95 (!) 167/87  Pulse: 80 78  Resp: 12 16  Temp:    SpO2: 94% 96%    Nursing Triage Note reviewed. Vital signs reviewed and patients oxygen saturation is normoxic  General: Patient is well nourished, well developed, awake and alert, resting comfortably in no acute distress Head: Normocephalic and  atraumatic Eyes: Normal inspection, extraocular muscles intact, no conjunctival pallor Ear, nose, throat: Normal external exam Neck: Normal range of motion Respiratory: Patient is in no respiratory distress, lungs CTAB Cardiovascular: Patient is not tachycardic, RR GI: Abd SNT with no guarding or rebound  Back: Normal inspection of the back with good strength and range of motion throughout all ext Extremities: pulses intact with good cap refills, patient has 1+ bilateral DP and PT pulses with less than 2-second cap refill in all toes.  Left leg extremely edematous consistent with lymphedema and right leg is less so.  There is overlying erythema consistent with a cellulitis Neuro: The patient is alert and oriented to person, place, and time, appropriately conversive, with 5/5 bilat UE/LE strength, no gross motor or sensory defects noted. Coordination appears to be adequate. Skin: Warm, dry, and intact Psych: normal mood and affect, no SI or HI  ED Results / Procedures / Treatments   Labs (all labs ordered are listed, but only abnormal results are displayed) Labs Reviewed  CBC - Abnormal; Notable for the following components:  Result Value   WBC 10.7 (*)    All other components within normal limits  COMPREHENSIVE METABOLIC PANEL WITH GFR - Abnormal; Notable for the following components:   BUN 31 (*)    All other components within normal limits  LACTIC ACID, PLASMA - Abnormal; Notable for the following components:   Lactic Acid, Venous 2.4 (*)    All other components within normal limits  CULTURE, BLOOD (ROUTINE X 2)  CULTURE, BLOOD (ROUTINE X 2)  LACTIC ACID, PLASMA  LACTIC ACID, PLASMA     EKG EKG and rhythm strip are interpreted by myself:   EKG: [Normal sinus rhythm] at heart rate of 77, normal QRS duration, QTc 461, normal ST segments and T waves no ectopy EKG not consistent with Acute STEMI Rhythm strip: NSR in lead II   RADIOLOGY Xray chest: No acute abnormality on  my independent review interpretation and radiologist agrees Doppler ultrasound: Left DVT of femoral vein that is occlusive CT PE: No pulmonary embolism no acute abnormalities    PROCEDURES:  Critical Care performed: No  Procedures   MEDICATIONS ORDERED IN ED: Medications  rivaroxaban  (XARELTO ) Education Kit for DVT/PE patients (has no administration in time range)  doxycycline  (VIBRA -TABS) tablet 100 mg (100 mg Oral Given 08/02/24 1824)  sodium chloride  0.9 % bolus 500 mL (0 mLs Intravenous Stopped 08/02/24 2208)  iohexol  (OMNIPAQUE ) 350 MG/ML injection 100 mL (100 mLs Intravenous Contrast Given 08/02/24 2007)  oxyCODONE -acetaminophen  (PERCOCET/ROXICET) 5-325 MG per tablet 1 tablet (1 tablet Oral Given 08/02/24 2004)  Rivaroxaban  (XARELTO ) tablet 15 mg (15 mg Oral Given 08/02/24 2208)     IMPRESSION / MDM / ASSESSMENT AND PLAN / ED COURSE                                Differential diagnosis includes, but is not limited to: Cellulitis, lymphedema, DVT, electrolyte derangement anemia, pneumonia, pulmonary embolism  ED course: Patient's legs are well-perfused and she has good pulses.  Overlying erythema is consistent with cellulitis superimposed over lymphedema.  I did take blood cultures and patient's lactate was initially elevated.  However this did improve with a small dose of IV fluids and she was never tachycardic or febrile.  I have initiated doxycycline  for the cellulitis.  Doppler ultrasound did demonstrate a DVT.  I did review the indications benefits risks and alternatives of initiating Xarelto  with the patient who has never had any intracranial hemorrhage or GI bleeding and she voiced understanding and opting to proceed.  She received her first dose this evening and I have sent a starter pack to her pharmacy of choice.  She was counseled that she will need a repeat ultrasound in 2 to 3 weeks and she will follow-up with her primary care physician.  Given or shortness of breath and  the DVT I did make the decision to get a CT PE which was unremarkable.  Patient feels comfortable returning home at this point.  She was subsequently driven home by her daughter and all questions answered   Clinical Course as of 08/02/24 2244  Fri Aug 02, 2024  1612 Lactic Acid, Venous(!!): 2.4 [HD]  1953 US  Venous Img Lower Bilateral (DVT) Patient does have an increase of thrombus in the vein [HD]  2240 Lactic Acid, Venous: 1.8 Repeat much lower than previous [HD]    Clinical Course User Index [HD] Nicholaus Rolland BRAVO, MD   At time of discharge there is no  evidence of acute life, limb, vision, or fertility threat. Patient has stable vital signs, pain is well controlled, patient is ambulatory and p.o. tolerant.  Discharge instructions were completed using the Epic system. I would refer you to those at this time. All warnings prescriptions follow-up etc. were discussed in detail with the patient. Patient indicates understanding and is agreeable with this plan. All questions answered.  Patient is made aware that they may return to the emergency department for any worsening or new condition or for any other emergency. --  Risk: 5 This patient has a high risk of morbidity due to further diagnostic testing or treatment. Rationale: This patient's evaluation and management involve a high risk of morbidity due to the potential severity of presenting symptoms, need for diagnostic testing, and/or initiation of treatment that may require close monitoring. The differential includes conditions with potential for significant deterioration or requiring escalation of care. Treatment decisions in the ED, including medication administration, procedural interventions, or disposition planning, reflect this level of risk. Additional Support: -- Drug therapy requiring intensive monitoring for toxicity [ ]  -- Decision regarding elective major surgery with idenitified patient or procedure risk factors [ ]  -- Decision  regarding hospitalization or escalation of hospital-level care [ ]  -- Decision not to resuscitate or to de-escalate care because of poor prognosis [ ]  -- Parental controlled substances [ ]   COPA: 5 The patient has a severe exacerbation, progression, or side effect of treatment of the following illness/illnesses: []  OR  The patient has the following acute or chronic illness/injury that poses a possible threat to life or bodily function: [X] : The patient has a potentially serious acute condition or an acute exacerbation of a chronic illness requiring urgent evaluation and management in the Emergency Department. The clinical presentation necessitates immediate consideration of life-threatening or function-threatening diagnoses, even if they are ultimately ruled out.  Data(2/3 categories following were performed): 5 I reviewed or ordered at least three unique tests, external notes, and/or the history required an independent historian as one of the three requirements as following: CBC, CMP, lactic acid AND  I independently interpreted the following test: X-ray of chest OR  I discussed the management of the patient with the following external physician or qualified healthcare provider: []     Suggested E/M Coding Level: 5, 99285, This has been selected based on the 08/29/2022 CPT guidelines for E/M codes in the Emergency Department based on 2/3 of the CoPA, Data, and Risk.     FINAL CLINICAL IMPRESSION(S) / ED DIAGNOSES   Final diagnoses:  Cellulitis of lower extremity, unspecified laterality  Acute deep vein thrombosis (DVT) of femoral vein of left lower extremity (HCC)     Rx / DC Orders   ED Discharge Orders          Ordered    RIVAROXABAN  (XARELTO ) VTE STARTER PACK (15 & 20 MG)        08/02/24 29-Aug-2138    doxycycline  (VIBRA -TABS) 100 MG tablet  2 times daily        08/02/24 08-29-2138    oxyCODONE -acetaminophen  (PERCOCET) 5-325 MG tablet  Every 4 hours PRN        08/02/24 08/29/38              Note:  This document was prepared using Dragon voice recognition software and may include unintentional dictation errors.   Nicholaus Rolland BRAVO, MD 08/02/24 [phone]

## 2024-08-03 ENCOUNTER — Telehealth: Payer: Self-pay | Admitting: Emergency Medicine

## 2024-08-03 MED ORDER — RIVAROXABAN (XARELTO) VTE STARTER PACK (15 & 20 MG): ORAL_TABLET | ORAL | 0 refills | Status: DC

## 2024-08-03 NOTE — Telephone Encounter (Signed)
 Patient advised [facility] in [place] did not have Xarelto  starter pack.  Discussed with the patient we will send prescription to [facility] in [place].  In the interim the patient understands not to utilize both, but pick up from either one of the 2 pharmacies, but not BOTH.  She is also reaching out to the [facility] in [place] to identify if other pharmacy might be neededin event neither has Xarelto  starter pack. She plans / knows to start this today as has blood clot in leg

## 2024-08-07 LAB — CULTURE, BLOOD (ROUTINE X 2)
Culture: NO GROWTH
Culture: NO GROWTH
Special Requests: ADEQUATE

## 2024-10-04 ENCOUNTER — Emergency Department
Admission: EM | Admit: 2024-10-04 | Discharge: 2024-10-04 | Disposition: A | Discharge status: Home / Self Care | Attending: Emergency Medicine | Admitting: Emergency Medicine

## 2024-10-04 ENCOUNTER — Other Ambulatory Visit: Payer: Self-pay

## 2024-10-04 ENCOUNTER — Emergency Department

## 2024-10-04 DIAGNOSIS — R600 Localized edema: Secondary | ICD-10-CM | POA: Insufficient documentation

## 2024-10-04 DIAGNOSIS — M7989 Other specified soft tissue disorders: Secondary | ICD-10-CM | POA: Diagnosis present

## 2024-10-04 DIAGNOSIS — Z7901 Long term (current) use of anticoagulants: Secondary | ICD-10-CM | POA: Diagnosis not present

## 2024-10-04 DIAGNOSIS — I10 Essential (primary) hypertension: Secondary | ICD-10-CM | POA: Insufficient documentation

## 2024-10-04 DIAGNOSIS — I82402 Acute embolism and thrombosis of unspecified deep veins of left lower extremity: Secondary | ICD-10-CM | POA: Diagnosis not present

## 2024-10-04 DIAGNOSIS — L03119 Cellulitis of unspecified part of limb: Secondary | ICD-10-CM

## 2024-10-04 LAB — CBC WITH DIFFERENTIAL/PLATELET
Abs Immature Granulocytes: 0.04 K/uL (ref 0.00–0.07)
Basophils Absolute: 0.1 K/uL (ref 0.0–0.1)
Basophils Relative: 1 %
Eosinophils Absolute: 0.1 K/uL (ref 0.0–0.5)
Eosinophils Relative: 1 %
HCT: 43.4 % (ref 36.0–46.0)
Hemoglobin: 13.5 g/dL (ref 12.0–15.0)
Immature Granulocytes: 1 %
Lymphocytes Relative: 21 %
Lymphs Abs: 1.7 K/uL (ref 0.7–4.0)
MCH: 28.8 pg (ref 26.0–34.0)
MCHC: 31.1 g/dL (ref 30.0–36.0)
MCV: 92.5 fL (ref 80.0–100.0)
Monocytes Absolute: 0.8 K/uL (ref 0.1–1.0)
Monocytes Relative: 9 %
Neutro Abs: 5.6 K/uL (ref 1.7–7.7)
Neutrophils Relative %: 67 %
Platelets: 251 K/uL (ref 150–400)
RBC: 4.69 MIL/uL (ref 3.87–5.11)
RDW: 12.9 % (ref 11.5–15.5)
WBC: 8.2 K/uL (ref 4.0–10.5)
nRBC: 0 % (ref 0.0–0.2)

## 2024-10-04 LAB — URINALYSIS, W/ REFLEX TO CULTURE (INFECTION SUSPECTED)
Bacteria, UA: NONE SEEN
Bilirubin Urine: NEGATIVE
Glucose, UA: NEGATIVE mg/dL
Hgb urine dipstick: NEGATIVE
Ketones, ur: NEGATIVE mg/dL
Leukocytes,Ua: NEGATIVE
Nitrite: NEGATIVE
Protein, ur: NEGATIVE mg/dL
Specific Gravity, Urine: 1.02 (ref 1.005–1.030)
pH: 5 (ref 5.0–8.0)

## 2024-10-04 LAB — COMPREHENSIVE METABOLIC PANEL WITH GFR
ALT: 26 U/L (ref 0–44)
AST: 34 U/L (ref 15–41)
Albumin: 3.8 g/dL (ref 3.5–5.0)
Alkaline Phosphatase: 112 U/L (ref 38–126)
Anion gap: 9 (ref 5–15)
BUN: 26 mg/dL — ABNORMAL HIGH (ref 8–23)
CO2: 26 mmol/L (ref 22–32)
Calcium: 9.4 mg/dL (ref 8.9–10.3)
Chloride: 104 mmol/L (ref 98–111)
Creatinine, Ser: 0.8 mg/dL (ref 0.44–1.00)
GFR, Estimated: >60 mL/min (ref >60)
Glucose, Bld: 102 mg/dL — ABNORMAL HIGH (ref 70–99)
Potassium: 4.7 mmol/L (ref 3.5–5.1)
Sodium: 139 mmol/L (ref 135–145)
Total Bilirubin: 0.6 mg/dL (ref 0.0–1.2)
Total Protein: 7.3 g/dL (ref 6.5–8.1)

## 2024-10-04 LAB — LACTIC ACID, PLASMA
Lactic Acid, Venous: 2.8 mmol/L (ref 0.5–1.9)
Lactic Acid, Venous: 1.3 mmol/L (ref 0.5–1.9)

## 2024-10-04 MED ORDER — SODIUM CHLORIDE 0.9 % IV BOLUS
1000.0000 mL | Freq: Once | INTRAVENOUS | Status: AC
  Administered 2024-10-04: 1000 mL via INTRAVENOUS

## 2024-10-04 MED ORDER — OXYCODONE HCL 5 MG PO CAPS: 5.0000 mg | ORAL_CAPSULE | Freq: Four times a day (QID) | ORAL | 0 refills | Status: DC | PRN

## 2024-10-04 MED ORDER — DOXYCYCLINE HYCLATE 100 MG PO TABS: 100.0000 mg | ORAL_TABLET | Freq: Two times a day (BID) | ORAL | 0 refills | Status: DC

## 2024-10-04 MED ORDER — DOXYCYCLINE HYCLATE 100 MG PO TABS
100.0000 mg | ORAL_TABLET | Freq: Once | ORAL | Status: AC
  Administered 2024-10-04: 100 mg via ORAL
  Filled 2024-10-04: qty 1

## 2024-10-04 MED ORDER — OXYCODONE-ACETAMINOPHEN 5-325 MG PO TABS
1.0000 | ORAL_TABLET | Freq: Once | ORAL | Status: AC
  Administered 2024-10-04: 1 via ORAL
  Filled 2024-10-04: qty 1

## 2024-10-04 NOTE — ED Triage Notes (Signed)
 Patient states bilateral leg redness and swelling for 2-3 days.

## 2024-10-04 NOTE — ED Notes (Signed)
 ABD dressing with kerlex placed to bilateral lower extremities.

## 2024-10-04 NOTE — ED Notes (Signed)
 Pt to xray

## 2024-10-04 NOTE — ED Notes (Signed)
 ED Provider at bedside.

## 2024-10-04 NOTE — ED Provider Notes (Signed)
.-----------------------------------------   3:46 PM on 10/04/2024 -----------------------------------------  Blood pressure (!) 149/65, pulse 72, temperature 97.6 F (36.4 C), temperature source Oral, resp. rate 16, height 5' 6 (1.676 m), weight (!) 161.5 kg, SpO2 98%.  Assuming care from Dr. Levander.  In short, Lydia Beard is a 78 y.o. female with a chief complaint of Leg Swelling .  Refer to the original H&P for additional details.  The current plan of care is to follow-up DVT study, if no new DVTs, and start antibiotics for cellulitis and likely discharge with outpatient follow-up, if DVT is prepped getting, will likely need admission for IV heparin..  DVT ultrasound is negative for DVT, did show a simple Baker's cyst.  On reassessment patient has no complaints at this time.  Discussed with her about imaging and lab results including incidental findings, she is aware of the Baker's cyst.  States that she is going to follow-up with lymphedema clinic soon, does have an assistant who comes to assist her at home.  Will give her first dose of antibiotics here, will send her antibiotics prescription to the pharmacy.  She is asking for refill of her oxycodone  for her chronic leg pain.  Will give her a short prescription here but have will have her follow-up with her primary care doctor next week to get her legs reassessed.  Considered but no indication for inpatient admission at this time, she safe for outpatient management.  Will discharge with strict return precautions.  Shared decision making with patient and she is agreeable with this plan.  Clinical Course as of 10/04/24 1600  Fri Oct 04, 2024  1523 Repeat lactate normalized.  Signed out to oncoming physician pending ultrasound and disposition.  If ultrasound without evidence of DVT, will likely be stable for discharge with oral antibiotics.  If DVT is propagating will likely require admission for failed outpatient management. [NR]  1550 US   Venous Img Lower Bilateral IMPRESSION: 1. No evidence of bilateral lower extremity deep venous thrombosis. 2. Simple appearing left Baker cyst measuring up to 4.5 cm.   [TT]    Clinical Course User Index [NR] Levander Slate, MD [TT] Waymond Lorelle Cummins, MD      Waymond Lorelle Cummins, MD 10/04/24 1600

## 2024-10-04 NOTE — ED Provider Notes (Addendum)
 [facility] Provider Note    Event Date/Time   First MD Initiated Contact with Patient 10/04/24 1155     (approximate)   History   Leg Swelling   HPI  Lydia Beard is a 78 year old female with history of arthritis, hypertension, lymphedema presenting to the emergency department for evaluation of leg swelling and pain.  Patient reports that for the past few days she has worsening redness and swelling in her lower extremities similar to when she has had cellulitis in the past.  Reports she has been compliant with her anticoagulation, does not specifically recall what it felt like to have a DVT.  No fevers.  No chest pain or shortness of breath.  Reviewed ER visit from 08/02/2024.  At that time patient presented with bilateral leg swelling worse on the left.  She was found to have a left-sided DVT, initiated on Xarelto , treated for cellulitis with doxycycline .  Labs overall reassuring at that time.  Patient reports that she did have resolution of her symptoms with treatment at that time.    Physical Exam   Triage Vital Signs: ED Triage Vitals  Encounter Vitals Group     BP 10/04/24 1131 (!) 155/68     Girls Systolic BP Percentile --      Girls Diastolic BP Percentile --      Boys Systolic BP Percentile --      Boys Diastolic BP Percentile --      Pulse Rate 10/04/24 1131 83     Resp 10/04/24 1131 18     Temp 10/04/24 1131 98.5 F (36.9 C)     Temp Source 10/04/24 1131 Oral     SpO2 10/04/24 1131 96 %     Weight 10/04/24 1132 (!) 356 lb (161.5 kg)     Height 10/04/24 1132 5' 6 (1.676 m)     Head Circumference --      Peak Flow --      Pain Score 10/04/24 1132 6     Pain Loc --      Pain Education --      Exclude from Growth Chart --     Most recent vital signs: Vitals:   10/04/24 1131 10/04/24 1315  BP: (!) 155/68 (!) 149/65  Pulse: 83 72  Resp: 18 16  Temp: 98.5 F (36.9 C) 97.6 F (36.4 C)  SpO2: 96% 98%     General: Awake,  interactive  CV:  Good peripheral perfusion Resp:  Unlabored respirations Abd:  Nondistended, nontender Neuro:  Symmetric facial movement, fluid speech Other:   Swelling of the bilateral lower extremities, intact distal pulses.  There is increased erythema of the left lower extremity compared to the contralateral side, see images below     ED Results / Procedures / Treatments   Labs (all labs ordered are listed, but only abnormal results are displayed) Labs Reviewed  LACTIC ACID, PLASMA - Abnormal; Notable for the following components:      Result Value   Lactic Acid, Venous 2.8 (*)    All other components within normal limits  COMPREHENSIVE METABOLIC PANEL WITH GFR - Abnormal; Notable for the following components:   Glucose, Bld 102 (*)    BUN 26 (*)    All other components within normal limits  URINALYSIS, W/ REFLEX TO CULTURE (INFECTION SUSPECTED) - Abnormal; Notable for the following components:   Color, Urine YELLOW (*)    APPearance CLEAR (*)    All other components within normal  limits  LACTIC ACID, PLASMA  CBC WITH DIFFERENTIAL/PLATELET     EKG EKG independently reviewed and interpreted by myself demonstrates:    RADIOLOGY Imaging independently reviewed and interpreted by myself demonstrates:  Chest x-Zackary Mckeone without focal consolidation Bilateral lower extremity ultrasound pending  Formal Radiology Read:  DG Chest 2 View Result Date: 10/04/2024 EXAM: 2 VIEW(S) XRAY OF THE CHEST 10/04/2024 12:09:00 PM COMPARISON: 08:15 08/02/2024. CLINICAL HISTORY: Concern for infection. Patient states bilateral leg redness and swelling for 2-3 days. FINDINGS: LUNGS AND PLEURA: No focal pulmonary opacity. No overt pulmonary edema. No pleural effusion. No pneumothorax. HEART AND MEDIASTINUM: Cardiomediastinal silhouette is unchanged. BONES AND SOFT TISSUES: No acute osseous abnormality. IMPRESSION: 1. No acute cardiopulmonary findings. Electronically signed by: Shahmeer Marcelino MD  10/04/2024 12:35 PM EDT RP Workstation: [id number]    PROCEDURES:  Critical Care performed: No  Procedures   MEDICATIONS ORDERED IN ED: Medications  sodium chloride  0.9 % bolus 1,000 mL (0 mLs Intravenous Stopped 10/04/24 1422)  oxyCODONE -acetaminophen  (PERCOCET/ROXICET) 5-325 MG per tablet 1 tablet (1 tablet Oral Given 10/04/24 1405)     IMPRESSION / MDM / ASSESSMENT AND PLAN / ED COURSE  I reviewed the triage vital signs and the nursing notes.  Differential diagnosis includes, but is not limited to, cellulitis, propagating DVT, swelling from known lymphedema  Patient's presentation is most consistent with acute presentation with potential threat to life or bodily function.  78 year old female presenting to the emergency department for evaluation of worsening leg swelling.  On exam she does have increased erythema of her left lower extremity compared to the contralateral side.  Did have recent DVT in this location.  Will obtain ultrasound of the bilateral lower extremities to further evaluate.  Labs with reassuring CBC, CMP.  Lactate elevated but no SIRS criteria noted.  Will give IV fluids and repeat to ensure downtrending. Clinical Course as of 10/04/24 1523  Fri Oct 04, 2024  1523 Repeat lactate normalized.  Signed out to oncoming physician pending ultrasound and disposition.  If ultrasound without evidence of DVT, will likely be stable for discharge with oral antibiotics.  If DVT is propagating will likely require admission for failed outpatient management. [NR]    Clinical Course User Index [NR] Levander Slate, MD     FINAL CLINICAL IMPRESSION(S) / ED DIAGNOSES   Final diagnoses:  Bilateral lower extremity edema     Rx / DC Orders   ED Discharge Orders     None        Note:  This document was prepared using Dragon voice recognition software and may include unintentional dictation errors.   Levander Slate, MD 10/04/24 [id number]    Levander Slate, MD 10/04/24 [phone]

## 2024-10-04 NOTE — Discharge Instructions (Addendum)
 Please do the antibiotics as prescribed for your cellulitis.  Please reserve the oxycodone  for severe breakthrough pain, please not drive or operate heavy machinery when you are on oxycodone  since it can make you sleepy.  Please be sure to follow-up with primary care doctor early next week to get your legs reassessed.  If the redness to your legs are not improving or are worsening after 2 days of antibiotics, please return to the emergency department to be seen.

## 2024-10-14 ENCOUNTER — Observation Stay

## 2024-10-14 ENCOUNTER — Inpatient Hospital Stay
Admission: EM | Admit: 2024-10-14 | Discharge: 2024-10-18 | DRG: 603 | Disposition: A | Discharge status: Skilled Nursing Facility

## 2024-10-14 DIAGNOSIS — Z6841 Body Mass Index (BMI) 40.0 and over, adult: Secondary | ICD-10-CM | POA: Diagnosis not present

## 2024-10-14 DIAGNOSIS — Z881 Allergy status to other antibiotic agents status: Secondary | ICD-10-CM | POA: Diagnosis not present

## 2024-10-14 DIAGNOSIS — Z79899 Other long term (current) drug therapy: Secondary | ICD-10-CM

## 2024-10-14 DIAGNOSIS — L03115 Cellulitis of right lower limb: Secondary | ICD-10-CM | POA: Diagnosis present

## 2024-10-14 DIAGNOSIS — E785 Hyperlipidemia, unspecified: Secondary | ICD-10-CM | POA: Diagnosis present

## 2024-10-14 DIAGNOSIS — Z885 Allergy status to narcotic agent status: Secondary | ICD-10-CM | POA: Diagnosis not present

## 2024-10-14 DIAGNOSIS — I890 Lymphedema, not elsewhere classified: Secondary | ICD-10-CM | POA: Diagnosis present

## 2024-10-14 DIAGNOSIS — L0390 Cellulitis, unspecified: Secondary | ICD-10-CM | POA: Diagnosis present

## 2024-10-14 DIAGNOSIS — Z7901 Long term (current) use of anticoagulants: Secondary | ICD-10-CM

## 2024-10-14 DIAGNOSIS — Z8249 Family history of ischemic heart disease and other diseases of the circulatory system: Secondary | ICD-10-CM | POA: Diagnosis not present

## 2024-10-14 DIAGNOSIS — E1142 Type 2 diabetes mellitus with diabetic polyneuropathy: Secondary | ICD-10-CM | POA: Diagnosis present

## 2024-10-14 DIAGNOSIS — K219 Gastro-esophageal reflux disease without esophagitis: Secondary | ICD-10-CM | POA: Diagnosis present

## 2024-10-14 DIAGNOSIS — L03116 Cellulitis of left lower limb: Secondary | ICD-10-CM | POA: Diagnosis present

## 2024-10-14 DIAGNOSIS — Z713 Dietary counseling and surveillance: Secondary | ICD-10-CM | POA: Diagnosis not present

## 2024-10-14 DIAGNOSIS — L03119 Cellulitis of unspecified part of limb: Secondary | ICD-10-CM | POA: Diagnosis not present

## 2024-10-14 DIAGNOSIS — Z833 Family history of diabetes mellitus: Secondary | ICD-10-CM

## 2024-10-14 DIAGNOSIS — Z86718 Personal history of other venous thrombosis and embolism: Secondary | ICD-10-CM

## 2024-10-14 DIAGNOSIS — Z7984 Long term (current) use of oral hypoglycemic drugs: Secondary | ICD-10-CM

## 2024-10-14 DIAGNOSIS — Z880 Allergy status to penicillin: Secondary | ICD-10-CM

## 2024-10-14 DIAGNOSIS — Z87891 Personal history of nicotine dependence: Secondary | ICD-10-CM

## 2024-10-14 DIAGNOSIS — I878 Other specified disorders of veins: Secondary | ICD-10-CM | POA: Diagnosis present

## 2024-10-14 DIAGNOSIS — I10 Essential (primary) hypertension: Secondary | ICD-10-CM | POA: Diagnosis present

## 2024-10-14 LAB — CBC WITH DIFFERENTIAL/PLATELET
Abs Immature Granulocytes: 0.08 K/uL — ABNORMAL HIGH (ref 0.00–0.07)
Basophils Absolute: 0.1 K/uL (ref 0.0–0.1)
Basophils Relative: 1 %
Eosinophils Absolute: 0.1 K/uL (ref 0.0–0.5)
Eosinophils Relative: 1 %
HCT: 44.3 % (ref 36.0–46.0)
Hemoglobin: 13.8 g/dL (ref 12.0–15.0)
Immature Granulocytes: 1 %
Lymphocytes Relative: 20 %
Lymphs Abs: 2.6 K/uL (ref 0.7–4.0)
MCH: 28.5 pg (ref 26.0–34.0)
MCHC: 31.2 g/dL (ref 30.0–36.0)
MCV: 91.5 fL (ref 80.0–100.0)
Monocytes Absolute: 0.9 K/uL (ref 0.1–1.0)
Monocytes Relative: 7 %
Neutro Abs: 9.3 K/uL — ABNORMAL HIGH (ref 1.7–7.7)
Neutrophils Relative %: 70 %
Platelets: 303 K/uL (ref 150–400)
RBC: 4.84 MIL/uL (ref 3.87–5.11)
RDW: 12.9 % (ref 11.5–15.5)
WBC: 13 K/uL — ABNORMAL HIGH (ref 4.0–10.5)
nRBC: 0 % (ref 0.0–0.2)

## 2024-10-14 LAB — LACTIC ACID, PLASMA
Lactic Acid, Venous: 1.6 mmol/L (ref 0.5–1.9)
Lactic Acid, Venous: 1.6 mmol/L (ref 0.5–1.9)

## 2024-10-14 LAB — BASIC METABOLIC PANEL WITH GFR
Anion gap: 11 (ref 5–15)
BUN: 35 mg/dL — ABNORMAL HIGH (ref 8–23)
CO2: 27 mmol/L (ref 22–32)
Calcium: 9.5 mg/dL (ref 8.9–10.3)
Chloride: 103 mmol/L (ref 98–111)
Creatinine, Ser: 0.96 mg/dL (ref 0.44–1.00)
GFR, Estimated: >60 mL/min (ref >60)
Glucose, Bld: 127 mg/dL — ABNORMAL HIGH (ref 70–99)
Potassium: 4.4 mmol/L (ref 3.5–5.1)
Sodium: 141 mmol/L (ref 135–145)

## 2024-10-14 LAB — MRSA NEXT GEN BY PCR, NASAL: MRSA by PCR Next Gen: NOT DETECTED

## 2024-10-14 MED ORDER — ONDANSETRON HCL 4 MG/2ML IJ SOLN: 4.0000 mg | Freq: Four times a day (QID) | INTRAMUSCULAR | Status: DC | PRN

## 2024-10-14 MED ORDER — KETOROLAC TROMETHAMINE 15 MG/ML IJ SOLN
15.0000 mg | Freq: Four times a day (QID) | INTRAMUSCULAR | Status: DC | PRN
  Administered 2024-10-14 – 2024-10-17 (×8): 15 mg via INTRAVENOUS
  Filled 2024-10-14 (×8): qty 1

## 2024-10-14 MED ORDER — SODIUM CHLORIDE 0.9 % IV SOLN
1.0000 g | Freq: Once | INTRAVENOUS | Status: AC
  Administered 2024-10-14: 1 g via INTRAVENOUS
  Filled 2024-10-14: qty 10

## 2024-10-14 MED ORDER — MORPHINE SULFATE (PF) 4 MG/ML IV SOLN
4.0000 mg | Freq: Once | INTRAVENOUS | Status: AC
  Administered 2024-10-14: 4 mg via INTRAVENOUS
  Filled 2024-10-14: qty 1

## 2024-10-14 MED ORDER — VANCOMYCIN HCL 1500 MG/300ML IV SOLN
1500.0000 mg | Freq: Once | INTRAVENOUS | Status: AC
  Administered 2024-10-14: 1500 mg via INTRAVENOUS
  Filled 2024-10-14 (×2): qty 300

## 2024-10-14 MED ORDER — ACETAMINOPHEN 650 MG RE SUPP: 650.0000 mg | Freq: Four times a day (QID) | RECTAL | Status: DC | PRN

## 2024-10-14 MED ORDER — MECLIZINE HCL 25 MG PO TABS: 25.0000 mg | ORAL_TABLET | Freq: Three times a day (TID) | ORAL | Status: DC | PRN

## 2024-10-14 MED ORDER — METFORMIN HCL 500 MG PO TABS
1000.0000 mg | ORAL_TABLET | Freq: Two times a day (BID) | ORAL | Status: DC
  Administered 2024-10-14 – 2024-10-15 (×3): 1000 mg via ORAL
  Filled 2024-10-14 (×3): qty 2

## 2024-10-14 MED ORDER — ACETAMINOPHEN 325 MG PO TABS
650.0000 mg | ORAL_TABLET | Freq: Four times a day (QID) | ORAL | Status: DC | PRN
  Administered 2024-10-15: 650 mg via ORAL
  Filled 2024-10-14: qty 2

## 2024-10-14 MED ORDER — VANCOMYCIN HCL IN DEXTROSE 1-5 GM/200ML-% IV SOLN
1000.0000 mg | Freq: Once | INTRAVENOUS | Status: AC
  Administered 2024-10-14: 1000 mg via INTRAVENOUS
  Filled 2024-10-14: qty 200

## 2024-10-14 MED ORDER — TIZANIDINE HCL 4 MG PO TABS
2.0000 mg | ORAL_TABLET | Freq: Every day | ORAL | Status: DC
  Administered 2024-10-14 – 2024-10-15 (×2): 2 mg via ORAL
  Filled 2024-10-14 (×2): qty 1

## 2024-10-14 MED ORDER — LISINOPRIL 20 MG PO TABS
40.0000 mg | ORAL_TABLET | Freq: Every day | ORAL | Status: DC
  Administered 2024-10-14 – 2024-10-15 (×2): 40 mg via ORAL
  Filled 2024-10-14 (×2): qty 2

## 2024-10-14 MED ORDER — RIVAROXABAN (XARELTO) VTE STARTER PACK (15 & 20 MG)
20.0000 mg | ORAL_TABLET | Freq: Every day | ORAL | Status: DC
Start: 2024-10-14 — End: 2024-10-14

## 2024-10-14 MED ORDER — OXYCODONE HCL 5 MG PO TABS
5.0000 mg | ORAL_TABLET | Freq: Four times a day (QID) | ORAL | Status: DC | PRN
Start: 2024-10-14 — End: 2024-10-18
  Administered 2024-10-14 – 2024-10-18 (×8): 5 mg via ORAL
  Filled 2024-10-14 (×8): qty 1

## 2024-10-14 MED ORDER — VANCOMYCIN HCL 1750 MG/350ML IV SOLN
1750.0000 mg | INTRAVENOUS | Status: DC
  Administered 2024-10-15 – 2024-10-18 (×4): 1750 mg via INTRAVENOUS
  Filled 2024-10-14 (×4): qty 350

## 2024-10-14 MED ORDER — RIVAROXABAN 20 MG PO TABS
20.0000 mg | ORAL_TABLET | Freq: Every day | ORAL | Status: DC
  Administered 2024-10-14 – 2024-10-18 (×5): 20 mg via ORAL
  Filled 2024-10-14 (×5): qty 1

## 2024-10-14 MED ORDER — FUROSEMIDE 10 MG/ML IJ SOLN
40.0000 mg | Freq: Every day | INTRAMUSCULAR | Status: DC
  Administered 2024-10-14 – 2024-10-17 (×4): 40 mg via INTRAVENOUS
  Filled 2024-10-14 (×4): qty 4

## 2024-10-14 MED ORDER — SODIUM CHLORIDE 0.9 % IV SOLN
2.0000 g | Freq: Three times a day (TID) | INTRAVENOUS | Status: DC
  Administered 2024-10-14 – 2024-10-18 (×12): 2 g via INTRAVENOUS
  Filled 2024-10-14 (×14): qty 12.5

## 2024-10-14 MED ORDER — ONDANSETRON HCL 4 MG PO TABS: 4.0000 mg | ORAL_TABLET | Freq: Four times a day (QID) | ORAL | Status: DC | PRN

## 2024-10-14 MED ORDER — HYDROCODONE-ACETAMINOPHEN 5-325 MG PO TABS
1.0000 | ORAL_TABLET | ORAL | Status: DC | PRN
  Administered 2024-10-14: 2 via ORAL
  Filled 2024-10-14: qty 2

## 2024-10-14 MED ORDER — PANTOPRAZOLE SODIUM 40 MG PO TBEC
40.0000 mg | DELAYED_RELEASE_TABLET | Freq: Every day | ORAL | Status: DC
  Administered 2024-10-14 – 2024-10-18 (×5): 40 mg via ORAL
  Filled 2024-10-14 (×5): qty 1

## 2024-10-14 NOTE — Progress Notes (Signed)
 PT Cancellation Note  Patient Details Name: Lydia Beard MRN: [id number] DOB: February 04, 1946   Cancelled Treatment:    Reason Eval/Treat Not Completed: Other (comment) (Patient eating lunch. Will follow up as schedule allows.)  Lydia Beard, PT, MPT   Lydia Beard 10/14/2024, 2:58 PM

## 2024-10-14 NOTE — H&P (Signed)
 History and Physical    Lydia Beard FMW:[id number] DOB: 1946-04-20 DOA: 10/14/2024  PCP: Geralene Levorn ORN, NP (Confirm with patient/family/NH records and if not entered, this has to be entered at [facility] point of entry) Patient coming from: Home  I have personally briefly reviewed patient's old medical records in [facility]  Chief Complaint: Leg pain and rash  HPI: Lydia Beard is a 78 y.o. female with medical history significant of HTN, IIDM, HLD, DVT, morbid obesity presented with worsening of bilateral lower extremity rash swelling and pain.  Patient has a bilateral lower extremity chronic swelling and rash associated with venous stasis, at this time, symptoms started 10 days ago, patient started to have bilateral lower extremity rash swelling and pain, came to ED 8 days ago, was diagnosed with bilateral lower extremity cellulitis and started on doxycycline .  After taking 7 days, patient noticed there has been no significant improvement, continued to experience sharp like pain and thin liquid weeping of bilateral shin area and calf area, and came to ED.  Denied any fever or chills.  She follow-up with wound care occasionally, was last seen by wound care 2 weeks ago.  ED Course: Afebrile, blood pressure 140/80 O2 saturation 99% on room air.  DVT study negative, blood work showed WBC 13.  Lactic acid 1.3.  Patient was started on vancomycin and ceftriaxone in the ED.  Review of Systems: As per HPI otherwise 14 point review of systems negative.    Past Medical History:  Diagnosis Date   Arthritis    GERD (gastroesophageal reflux disease)    Hypertension    Obesity    Peripheral neuropathy    Prediabetes     Past Surgical History:  Procedure Laterality Date   COLONOSCOPY  2012   cleared for 5 yrs   MOUTH SURGERY       reports that she quit smoking about 57 years ago. Her smoking use included cigarettes. She has never used smokeless tobacco. She reports current  alcohol use. She reports that she does not use drugs.  Allergies  Allergen Reactions   Codeine Nausea And Vomiting   Erythromycin Other (See Comments)   Penicillins Swelling    Family History  Problem Relation Age of Onset   Cancer Mother        cervical   Diabetes Mother    Diabetes Father    Heart disease Father    Hypertension Father      Prior to Admission medications   Medication Sig Start Date End Date Taking? Authorizing Provider  acetaminophen  (TYLENOL ) 500 MG tablet Take 2 tablets (1,000 mg total) by mouth 2 (two) times daily. 09/16/15   Plonk, Elsie, MD  doxycycline  (VIBRA -TABS) 100 MG tablet Take 1 tablet (100 mg total) by mouth 2 (two) times daily for 10 days. 10/04/24 10/14/24  Waymond Lorelle Cummins, MD  furosemide  (LASIX ) 40 MG tablet TAKE 1 TABLET DAILY 08/05/19   Jones, Deanna C, MD  HYDROcodone -acetaminophen  (NORCO/VICODIN) 5-325 MG tablet Take 1 tablet by mouth every 4 (four) hours as needed. 04/08/24   Dorothyann Drivers, MD  lisinopril  (ZESTRIL ) 40 MG tablet TAKE 1 TABLET DAILY 10/08/19   Jones, Deanna C, MD  meclizine  (ANTIVERT ) 25 MG tablet Take by mouth.    [provider]  metFORMIN  (GLUCOPHAGE ) 1000 MG tablet TAKE 1 TABLET TWICE A DAY WITH MEALS 08/12/19   Jones, Deanna C, MD  Multiple Vitamin (MULTIVITAMIN) tablet Take 1 tablet by mouth daily.    [provider]  naproxen sodium (ALEVE) 220 MG tablet Take by mouth.    [provider]  omeprazole  (PRILOSEC) 40 MG capsule TAKE 1 CAPSULE DAILY 10/08/19   Jones, Deanna C, MD  ondansetron  (ZOFRAN -ODT) 4 MG disintegrating tablet Take 1 tablet (4 mg total) by mouth every 8 (eight) hours as needed for nausea or vomiting. 09/04/23   Arlander Charleston, MD  oxycodone  (OXY-IR) 5 MG capsule Take 1 capsule (5 mg total) by mouth every 6 (six) hours as needed for up to 8 doses. 10/04/24   Waymond Lorelle Cummins, MD  RIVAROXABAN  (XARELTO ) VTE STARTER PACK (15 & 20 MG) Follow package directions: Take one 15mg  tablet by  mouth twice a day. On day 22, switch to one 20mg  tablet once a day. Take with food. 08/03/24   Dicky Anes, MD  [facility] WORT PO Take 300 mg by mouth daily.     [provider]  tiZANidine  (ZANAFLEX ) 4 MG tablet TAKE ONE-HALF (1/2) TABLET DAILY 10/08/19   Joshua Cathryne BROCKS, MD    Physical Exam: Vitals:   10/14/24 0329 10/14/24 0330 10/14/24 0334 10/14/24 0530  BP: (!) 141/100  (!) 147/85 137/67  Pulse: 86  87 77  Resp: 20     Temp: 98 F (36.7 C)     TempSrc: Oral     SpO2: 96%  99% 98%  Weight:  (!) 161.5 kg    Height:  5' 6 (1.676 m)      Constitutional: NAD, calm, comfortable Vitals:   10/14/24 0329 10/14/24 0330 10/14/24 0334 10/14/24 0530  BP: (!) 141/100  (!) 147/85 137/67  Pulse: 86  87 77  Resp: 20     Temp: 98 F (36.7 C)     TempSrc: Oral     SpO2: 96%  99% 98%  Weight:  (!) 161.5 kg    Height:  5' 6 (1.676 m)     Eyes: PERRL, lids and conjunctivae normal ENMT: Mucous membranes are moist. Posterior pharynx clear of any exudate or lesions.Normal dentition.  Neck: normal, supple, no masses, no thyromegaly Respiratory: clear to auscultation bilaterally, no wheezing, no crackles. Normal respiratory effort. No accessory muscle use.  Cardiovascular: Regular rate and rhythm, no murmurs / rubs / gallops. No extremity edema. 2+ pedal pulses. No carotid bruits.  Abdomen: no tenderness, no masses palpated. No hepatosplenomegaly. Bowel sounds positive.  Musculoskeletal: no clubbing / cyanosis. No joint deformity upper and lower extremities. Good ROM, no contractures. Normal muscle tone.  Skin: Rash warm and tender to touch of bilateral lower extremity below the knees, with multiple shallow ulcers bilaterally with green-yellowish discharge Neurologic: CN 2-12 grossly intact. Sensation intact, DTR normal. Strength 5/5 in all 4.  Psychiatric: Normal judgment and insight. Alert and oriented x 3. Normal mood.          Labs on Admission: I have personally reviewed  following labs and imaging studies  CBC: Recent Labs  Lab 10/14/24 0341  WBC 13.0*  NEUTROABS 9.3*  HGB 13.8  HCT 44.3  MCV 91.5  PLT 303   Basic Metabolic Panel: Recent Labs  Lab 10/14/24 0341  NA 141  K 4.4  CL 103  CO2 27  GLUCOSE 127*  BUN 35*  CREATININE 0.96  CALCIUM 9.5   GFR: Estimated Creatinine Clearance: 78.9 mL/min (by C-G formula based on SCr of 0.96 mg/dL). Liver Function Tests: No results for input(s): AST, ALT, ALKPHOS, BILITOT, PROT, ALBUMIN in the last 168 hours. No results for input(s): LIPASE, AMYLASE in  the last 168 hours. No results for input(s): AMMONIA in the last 168 hours. Coagulation Profile: No results for input(s): INR, PROTIME in the last 168 hours. Cardiac Enzymes: No results for input(s): CKTOTAL, CKMB, CKMBINDEX, TROPONINI in the last 168 hours. BNP (last 3 results) No results for input(s): PROBNP in the last 8760 hours. HbA1C: No results for input(s): HGBA1C in the last 72 hours. CBG: No results for input(s): GLUCAP in the last 168 hours. Lipid Profile: No results for input(s): CHOL, HDL, LDLCALC, TRIG, CHOLHDL, LDLDIRECT in the last 72 hours. Thyroid  Function Tests: No results for input(s): TSH, T4TOTAL, FREET4, T3FREE, THYROIDAB in the last 72 hours. Anemia Panel: No results for input(s): VITAMINB12, FOLATE, FERRITIN, TIBC, IRON, RETICCTPCT in the last 72 hours. Urine analysis:    Component Value Date/Time   COLORURINE YELLOW (A) 10/04/2024 1435   APPEARANCEUR CLEAR (A) 10/04/2024 1435   APPEARANCEUR HAZY 07/19/2012 1017   LABSPEC 1.020 10/04/2024 1435   LABSPEC 1.010 07/19/2012 1017   PHURINE 5.0 10/04/2024 1435   GLUCOSEU NEGATIVE 10/04/2024 1435   GLUCOSEU NEGATIVE 07/19/2012 1017   HGBUR NEGATIVE 10/04/2024 1435   BILIRUBINUR NEGATIVE 10/04/2024 1435   BILIRUBINUR NEGATIVE 07/19/2012 1017   KETONESUR NEGATIVE 10/04/2024 1435   PROTEINUR NEGATIVE  10/04/2024 1435   NITRITE NEGATIVE 10/04/2024 1435   LEUKOCYTESUR NEGATIVE 10/04/2024 1435   LEUKOCYTESUR 2+ 07/19/2012 1017    Radiological Exams on Admission: No results found.  EKG: None  Assessment/Plan Principal Problem:   Cellulitis  (please populate well all problems here in Problem List. (For example, if patient is on BP meds at home and you resume or decide to hold them, it is a problem that needs to be her. Same for CAD, COPD, HLD and so on)  Cellulitis of bilateral lower extremity, failed outpatient management - Cellulitis involves more than one third of surface area of bilateral extremities, failed outpatient management, indication for IV antibiotics. - Continue broad-spectrum antibiotics including vancomycin, given the increased risk of Pseudomonas infection in diabetes with chronic wounds,, will change ceftriaxone to cefepime. - MRSA screening - Wound care consult  IIDM - Continue metformin   History of DVT - Continue Xarelto   HTN - Continue lisinopril   Venous stasis Peripheral edema - With worsening of lower extremity edema, change p.o. Lasix  to IV Lasix  - Other DDx, normal LVEF and right heart function on recent echocardiogram.  Morbid obesity - BMI= 57 - Outpatient GLP-1 agonist therapy evaluation  DVT prophylaxis: Xarelto  Code Status: Full code Family Communication: None [place] Disposition Plan: Patient is sick with extensive lower extremity cellulitis failed outpatient management, requiring broad-spectrum antibiotics, expect more than 2 midnight hospital stay. Consults called: None Admission status: MedSurg admission   Cort ONEIDA Mana MD [facility] Pager [phone]  10/14/2024, 7:58 AM

## 2024-10-14 NOTE — Plan of Care (Signed)
  Problem: Health Behavior/Discharge Planning: Goal: Ability to manage health-related needs will improve Outcome: Progressing   Problem: Clinical Measurements: Goal: Ability to maintain clinical measurements within normal limits will improve Outcome: Progressing Goal: Will remain free from infection Outcome: Progressing Goal: Cardiovascular complication will be avoided Outcome: Progressing   Problem: Activity: Goal: Risk for activity intolerance will decrease Outcome: Progressing   Problem: Nutrition: Goal: Adequate nutrition will be maintained Outcome: Progressing   

## 2024-10-14 NOTE — Progress Notes (Signed)
 Pt arrived to unit, transferred safely into bed from ED stretcher. Pt calm and cooperative. Respirations even and unlabored on RA. Pt A&O X4. Endorsing 7/10 pain in Bilateral lower extremities. Pt oriented to room and demonstrates proper use of call bell.

## 2024-10-14 NOTE — Consult Note (Signed)
 WOC Nurse Consult Note: patient with history of lymphedema, has been referred to lymphedema clinic per PCP notes; receiving home health  Reason for Consult: B lower leg wounds  Wound type: full thickness likely r/t venous insufficiency and lymphedema  Pressure Injury POA: NA  Measurement: see nursing flowsheet  Wound bed: anterior leg wounds with film of tan necrotic tissue; posterior leg wounds appear pink moist  Drainage (amount, consistency, odor)  some purulent on old bandages per MD notes  Periwound: edema and erythema (being treated for cellulitis)  Dressing procedure/placement/frequency: Cleanse B lower leg wounds with Vashe wound cleanser Soila [phone]) do not rinse and allow to air dry. Apply silver hydrofiber ([place] [phone]) cut to fit wound beds daily, cover with ABD pad and secure with kerlix roll gauze beginning right above toes and ending right below knees.  Apply Ace bandage wrapped in same fashion as Kerlix for light compression.  Elevate legs as much as possible.   POC discussed with bedside nurse.  Patient should continue ongoing management with home health and lymphedema clinic.   WOC team will not follow. Re-consult if further needs arise.   Thank you,    Powell Bar MSN, RN-BC, [employer]

## 2024-10-14 NOTE — ED Triage Notes (Signed)
 Patient presents to the ED via EMS from home for complaints of bilateral leg pain and swelling. Open wounds present, MD assessing and providing pictures to her chart. Patient denies chest pain and SOB at this time. VSS, GCS of 15.

## 2024-10-14 NOTE — Evaluation (Signed)
 Occupational Therapy Evaluation Patient Details Name: Lydia Beard MRN: [id number] DOB: March 06, 1946 Today's Date: 10/14/2024   History of Present Illness   78 y.o. female with medical history significant of HTN, IIDM, HLD, DVT, morbid obesity presented with worsening of bilateral lower extremity rash swelling and pain.     Clinical Impressions Patient presenting with decreased Ind in self care,balance, functional mobility/transfers, endurance, and safety awareness.Patient reports being Mod I at baseline and living at home with family. Pt reports overall not having assistance from family but having an aide that comes 1x/wk to assist with IADLs. Patient normally uses manual wheelchair for mobility but does transfers and ambulate short distances like into bathroom with SPC or RW. Pt does self care tasks Ind at baseline. Pt needing assistance with B LEs to exit bed but stands with min guard from bed with RW. Pt step pivots with supervision to [facility] for toileting needs and able to void. She stands from commode and leans towards bed to perform hygiene without assistance. Pt then takes several steps along EOB to sit back on bed and needing assist to get B LEs back into bed at end of session.  Patient will benefit from acute OT to increase overall independence in the areas of ADLs, functional mobility, and safety awareness n order to safely discharge.     If plan is discharge home, recommend the following:   A little help with walking and/or transfers;A little help with bathing/dressing/bathroom;Assistance with cooking/housework;Help with stairs or ramp for entrance;Assist for transportation     Functional Status Assessment   Patient has had a recent decline in their functional status and demonstrates the ability to make significant improvements in function in a reasonable and predictable amount of time.     Equipment Recommendations   None recommended by OT       Precautions/Restrictions   Precautions Precautions: Fall     Mobility Bed Mobility Overal bed mobility: Needs Assistance Bed Mobility: Supine to Sit, Sit to Supine     Supine to sit: Min assist Sit to supine: Min assist   General bed mobility comments: assistance for B LEs    Transfers Overall transfer level: Needs assistance Equipment used: Rolling walker (2 wheels), 1 person hand held assist Transfers: Sit to/from Stand, Bed to chair/wheelchair/BSC Sit to Stand: Contact guard assist     Step pivot transfers: Supervision            Balance Overall balance assessment: Needs assistance Sitting-balance support: Feet supported Sitting balance-Leahy Scale: Good     Standing balance support: During functional activity, Bilateral upper extremity supported Standing balance-Leahy Scale: Good                             ADL either performed or assessed with clinical judgement   ADL Overall ADL's : Needs assistance/impaired                         Toilet Transfer: Rolling walker (2 wheels);BSC/3in1;Ambulation;Contact guard assist;Supervision/safety   Toileting- Clothing Manipulation and Hygiene: Supervision/safety;Sit to/from stand               Vision Patient Visual Report: No change from baseline              Pertinent Vitals/Pain Pain Assessment Pain Assessment: Faces Faces Pain Scale: Hurts little more Pain Location: B LEs Pain Descriptors / Indicators: Discomfort, Guarding, Aching Pain Intervention(s): Limited activity within  patient's tolerance, Monitored during session, Premedicated before session, Repositioned     Extremity/Trunk Assessment Upper Extremity Assessment Upper Extremity Assessment: Generalized weakness   Lower Extremity Assessment Lower Extremity Assessment: Generalized weakness       Communication Communication Communication: No apparent difficulties   Cognition Arousal: Alert Behavior During  Therapy: WFL for tasks assessed/performed Cognition: No apparent impairments                                       Cueing  General Comments   Cueing Techniques: Verbal cues              Home Living Family/patient expects to be discharged to:: Private residence Living Arrangements: Other relatives;Non-relatives/Friends (lives with grand daughter and her boyfriend) Available Help at Discharge: Family;Available PRN/intermittently Type of Home: House Home Access: Ramped entrance     Home Layout: One level     Bathroom Shower/Tub: Producer, Television/film/video: Handicapped height     Home Equipment: Cane - single point;Tub bench;Rolling Walker (2 wheels);Wheelchair - manual;BSC/3in1          Prior Functioning/Environment Prior Level of Function : Needs assist;Independent/Modified Independent               ADLs Comments: Pt has an aide that comes 1x/wk to help with IADLs in home. She is Ind with self care tasks at baseline. Uses manual w/c for mobility most of the time and RW and cane if needed for transfers or to get into bathroom.    OT Problem List: Decreased strength;Decreased activity tolerance;Decreased range of motion;Impaired balance (sitting and/or standing);Decreased safety awareness;Decreased knowledge of use of DME or AE;Cardiopulmonary status limiting activity   OT Treatment/Interventions: Self-care/ADL training;Manual therapy;Therapeutic exercise;Patient/family education;Balance training;Neuromuscular education;Energy conservation;Therapeutic activities;DME and/or AE instruction      OT Goals(Current goals can be found in the care plan section)   Acute Rehab OT Goals Patient Stated Goal: to go home OT Goal Formulation: With patient Time For Goal Achievement: 10/28/24 Potential to Achieve Goals: Fair ADL Goals Pt Will Perform Grooming: with modified independence;standing Pt Will Perform Lower Body Dressing: with modified  independence;sit to/from stand Pt Will Transfer to Toilet: with modified independence;ambulating Pt Will Perform Toileting - Clothing Manipulation and hygiene: with modified independence;sit to/from stand   OT Frequency:  Min 2X/week       AM-PAC OT 6 Clicks Daily Activity     Outcome Measure Help from another person eating meals?: None Help from another person taking care of personal grooming?: A Little Help from another person toileting, which includes using toliet, bedpan, or urinal?: A Little Help from another person bathing (including washing, rinsing, drying)?: A Little Help from another person to put on and taking off regular upper body clothing?: None Help from another person to put on and taking off regular lower body clothing?: A Little 6 Click Score: 20   End of Session Equipment Utilized During Treatment: Rolling walker (2 wheels) Nurse Communication: Mobility status  Activity Tolerance: Patient tolerated treatment well Patient left: in bed;with call bell/phone within reach  OT Visit Diagnosis: Unsteadiness on feet (R26.81);Repeated falls (R29.6);Muscle weakness (generalized) (M62.81)                Time: 0932-1000 OT Time Calculation (min): 28 min Charges:  OT General Charges $OT Visit: 1 Visit OT Evaluation $OT Eval Moderate Complexity: 1 Mod OT Treatments $Self Care/Home Management :  8-22 mins  Izetta Claude, MS, OTR/L , CBIS ascom [phone]  10/14/24, 12:44 PM

## 2024-10-14 NOTE — Progress Notes (Signed)
 ED Pharmacy Antibiotic Sign Off An antibiotic consult was received from an ED provider for Vancomycin per pharmacy dosing for cellulitis. A chart review was completed to assess appropriateness.   The following one time order(s) were placed:  Vancomycin 2500 mg IV X 1.   Further antibiotic and/or antibiotic pharmacy consults should be ordered by the admitting provider if indicated.   Thank you for allowing pharmacy to be a part of this patient's care.   Silver Selinda [facility]  Clinical Pharmacist 10/14/24 3:42 AM

## 2024-10-14 NOTE — ED Provider Notes (Signed)
 [facility] Provider Note    Event Date/Time   First MD Initiated Contact with Patient 10/14/24 [phone]     (approximate)   History   Leg Pain (W/out injury)   HPI  Lydia Beard is a 78 y.o. female   Past medical history of hypertension, obesity, peripheral neuropathy, prediabetic, arthritis and GERD, chronic lower extremity edema, presents emergency department with cellulitis of lower extremities.  She was diagnosed with cellulitis of bilateral shins over 1 week ago and has been on her doxycycline  for over a week.  However the redness and warmth have continued to spread.  She denies any fevers or chills or any other acute medical complaints.  No new injuries.  There is some weeping, purulence under the bandages that she placed on the shins.  No GI or GU complaints.  Independent Historian contributed to assessment above: EMS gives report as above, normal vital signs  External Medical Documents Reviewed: Hospital notes from about a week ago for cellulitis      Physical Exam   Triage Vital Signs: ED Triage Vitals  Encounter Vitals Group     BP 10/14/24 0329 (!) 141/100     Girls Systolic BP Percentile --      Girls Diastolic BP Percentile --      Boys Systolic BP Percentile --      Boys Diastolic BP Percentile --      Pulse Rate 10/14/24 0329 86     Resp 10/14/24 0329 20     Temp 10/14/24 0329 98 F (36.7 C)     Temp Source 10/14/24 0329 Oral     SpO2 10/14/24 0328 98 %     Weight --      Height --      Head Circumference --      Peak Flow --      Pain Score --      Pain Loc --      Pain Education --      Exclude from Growth Chart --     Most recent vital signs: Vitals:   10/14/24 0329 10/14/24 0334  BP: (!) 141/100 (!) 147/85  Pulse: 86 87  Resp: 20   Temp: 98 F (36.7 C)   SpO2: 96% 99%    General: Awake, no distress.  CV:  Good peripheral perfusion.  Resp:  Normal effort.  Abd:  No distention.  Other:  Bilateral lower  extremity redness and warmth.  No crepitus or pain out of proportion.  No fluctuance.  See photos.  Hypertensive, afebrile.          ED Results / Procedures / Treatments   Labs (all labs ordered are listed, but only abnormal results are displayed) Labs Reviewed  BASIC METABOLIC PANEL WITH GFR - Abnormal; Notable for the following components:      Result Value   Glucose, Bld 127 (*)    BUN 35 (*)    All other components within normal limits  CBC WITH DIFFERENTIAL/PLATELET - Abnormal; Notable for the following components:   WBC 13.0 (*)    Neutro Abs 9.3 (*)    Abs Immature Granulocytes 0.08 (*)    All other components within normal limits  LACTIC ACID, PLASMA  LACTIC ACID, PLASMA     I ordered and reviewed the above labs they are notable for leukocytosis, normal lactic  PROCEDURES:  Critical Care performed: No  Procedures   MEDICATIONS ORDERED IN ED: Medications  vancomycin (VANCOCIN) IVPB 1000  mg/200 mL premix (has no administration in time range)    Followed by  vancomycin (VANCOREADY) IVPB 1500 mg/300 mL (has no administration in time range)  cefTRIAXone (ROCEPHIN) 1 g in sodium chloride  0.9 % 100 mL IVPB (0 g Intravenous Stopped 10/14/24 0358)    External physician / consultants:  I spoke with hospital medicine for admission and regarding care plan for this patient.   IMPRESSION / MDM / ASSESSMENT AND PLAN / ED COURSE  I reviewed the triage vital signs and the nursing notes.                                Patient's presentation is most consistent with acute presentation with potential threat to life or bodily function.  Differential diagnosis includes, but is not limited to, cellulitis, abscess, necrotizing fasciitis, cellulitis, sepsis considered but less likely DVT   The patient is on the cardiac monitor to evaluate for evidence of arrhythmia and/or significant heart rate changes.  MDM:    Bilateral lower extremity edema and cellulitic changes  worsening despite outpatient antibiotic use.  Considered DVT but unlikely given bilateral nature and cellulitic changes only in the skin, with a DVT ultrasound last evaluation approximately a week ago that was negative.  No evidence of abscess, necrotizing fasciitis fortunately.    Chronic unchanged degree of swelling in bilateral lower extremity, without chest pain or shortness of breath doubt acute CHF exacerbation.    Will start on IV antibiotics and admit.       FINAL CLINICAL IMPRESSION(S) / ED DIAGNOSES   Final diagnoses:  Cellulitis of lower leg     Rx / DC Orders   ED Discharge Orders     None        Note:  This document was prepared using Dragon voice recognition software and may include unintentional dictation errors.    Cyrena Mylar, MD 10/14/24 [phone]

## 2024-10-14 NOTE — Consult Note (Signed)
 Pharmacy Antibiotic Note  Lydia Beard is a 78 y.o. female admitted on 10/14/2024 with cellulitis of lower extremities.  Pharmacy has been consulted for Vancomycin and Cefepime dosing.  Plan: Vancomycin 1750 mg IV Q 24 hrs. Goal AUC 400-550. Expected AUC: 502.4 Expected Cmin: 13.0 SCr used: 0.96, Vd used: 0.5  Cefepime 2g IV Q8 hours  Height: 5' 6 (167.6 cm) Weight: (!) 161.5 kg (356 lb) IBW/kg (Calculated) : 59.3  Temp (24hrs), Avg:98 F (36.7 C), Min:98 F (36.7 C), Max:98 F (36.7 C)  Recent Labs  Lab 10/14/24 0341  WBC 13.0*  CREATININE 0.96  LATICACIDVEN 1.6    Estimated Creatinine Clearance: 78.9 mL/min (by C-G formula based on SCr of 0.96 mg/dL).    Allergies  Allergen Reactions   Codeine Nausea And Vomiting   Erythromycin Other (See Comments)   Penicillins Swelling    Antimicrobials this admission: Vancomycin 10/27 >>  Cefepime 10/27 >>   Dose adjustments this admission: N/A  Microbiology results: 10/27 MRSA PCR: pending  Thank you for allowing pharmacy to be a part of this patient's care.  Kamrin Spath A Konner Saiz 10/14/2024 7:43 AM

## 2024-10-15 DIAGNOSIS — L03119 Cellulitis of unspecified part of limb: Secondary | ICD-10-CM | POA: Diagnosis not present

## 2024-10-15 LAB — CBC
HCT: 38.9 % (ref 36.0–46.0)
Hemoglobin: 13 g/dL (ref 12.0–15.0)
MCH: 29.7 pg (ref 26.0–34.0)
MCHC: 33.4 g/dL (ref 30.0–36.0)
MCV: 89 fL (ref 80.0–100.0)
Platelets: 292 K/uL (ref 150–400)
RBC: 4.37 MIL/uL (ref 3.87–5.11)
RDW: 12.9 % (ref 11.5–15.5)
WBC: 12.2 K/uL — ABNORMAL HIGH (ref 4.0–10.5)
nRBC: 0 % (ref 0.0–0.2)

## 2024-10-15 LAB — HEMOGLOBIN A1C
Hgb A1c MFr Bld: 5.7 % — ABNORMAL HIGH (ref 4.8–5.6)
Mean Plasma Glucose: 116.89 mg/dL

## 2024-10-15 LAB — GLUCOSE, CAPILLARY
Glucose-Capillary: 121 mg/dL — ABNORMAL HIGH (ref 70–99)
Glucose-Capillary: 107 mg/dL — ABNORMAL HIGH (ref 70–99)

## 2024-10-15 MED ORDER — INSULIN ASPART 100 UNIT/ML IJ SOLN: 0.0000 [IU] | Freq: Every day | INTRAMUSCULAR | Status: DC

## 2024-10-15 MED ORDER — INSULIN ASPART 100 UNIT/ML IJ SOLN
0.0000 [IU] | Freq: Three times a day (TID) | INTRAMUSCULAR | Status: DC
  Administered 2024-10-15 – 2024-10-18 (×2): 1 [IU] via SUBCUTANEOUS
  Filled 2024-10-15 (×2): qty 1

## 2024-10-15 MED ORDER — GABAPENTIN 300 MG PO CAPS
300.0000 mg | ORAL_CAPSULE | Freq: Three times a day (TID) | ORAL | Status: DC
Start: 2024-10-15 — End: 2024-10-16
  Administered 2024-10-15 – 2024-10-16 (×4): 300 mg via ORAL
  Filled 2024-10-15 (×4): qty 1

## 2024-10-15 MED ORDER — INSULIN GLARGINE-YFGN 100 UNIT/ML ~~LOC~~ SOLN
10.0000 [IU] | Freq: Every day | SUBCUTANEOUS | Status: DC
  Administered 2024-10-15 – 2024-10-17 (×3): 10 [IU] via SUBCUTANEOUS
  Filled 2024-10-15 (×4): qty 0.1

## 2024-10-15 MED ORDER — TIZANIDINE HCL 4 MG PO TABS
4.0000 mg | ORAL_TABLET | Freq: Every day | ORAL | Status: DC
  Administered 2024-10-16 – 2024-10-17 (×2): 4 mg via ORAL
  Filled 2024-10-15 (×2): qty 1

## 2024-10-15 NOTE — Consult Note (Signed)
 WOC Nurse Consult Note: Consult requested for bilat leg wounds.  This was already performed on 10/27; refer to previous consult note, and topical treatment orders have been provided for bedside nurses to perform.   Please re-consult if further assistance is needed.  Thank-you,  Stephane Fought MSN, RN, CWOCN, CWCN-AP, CNS Contact Mon-Fri 0700-1500: [phone]

## 2024-10-15 NOTE — Plan of Care (Signed)
  Problem: Skin Integrity: Goal: Risk for impaired skin integrity will decrease Outcome: Progressing   Problem: Fluid Volume: Goal: Ability to maintain a balanced intake and output will improve Outcome: Progressing   Problem: Safety: Goal: Ability to remain free from injury will improve Outcome: Progressing   Problem: Skin Integrity: Goal: Risk for impaired skin integrity will decrease Outcome: Progressing   Problem: Elimination: Goal: Will not experience complications related to bowel motility Outcome: Progressing

## 2024-10-15 NOTE — Progress Notes (Signed)
 Progress Note   Patient: Lydia Beard FMW:[id number] DOB: 16-Jan-1946 DOA: 10/14/2024     1 DOS: the patient was seen and examined on 10/15/2024   Brief hospital course:  Lydia Beard is a 78 y.o. female with medical history significant of HTN, IIDM, HLD, DVT, morbid obesity presented with worsening of bilateral lower extremity rash swelling and pain.  Patient found to have bilateral lower extremity purulent cellulitis.    Assessment and Plan:  Cellulitis of bilateral lower extremity, failed outpatient management - Cellulitis involves more than one third of surface area of bilateral extremities, failed outpatient management, indication for IV antibiotics. - Continue broad-spectrum antibiotics including vancomycin and cefepime, given the increased risk of Pseudomonas infection in diabetes with chronic wounds Wound care consulted Lower extremity elevation Follow-up on culture results to guide antibiotic therapy   Type 2 diabetes mellitus Metformin  discontinued while inpatient I will place the patient on insulin therapy Monitor glucose level closely   History of DVT - Continue Xarelto    Essential hypertension - Continue lisinopril    Venous stasis Peripheral edema - With worsening of lower extremity edema, changed p.o. Lasix  to IV Lasix  - Other DDx, normal LVEF and right heart function on recent echocardiogram.   Morbid obesity - BMI= 57 Counseled on weight loss with diet and exercise when medically stable  Peripheral neuropathy-continue gabapentin   DVT prophylaxis: Xarelto   Code Status: Full code  Family Communication: Son present at bedside Disposition Plan: Pending clinical course    Subjective:  Patient seen and examined at bedside this morning Denies chest pain nausea vomiting abdominal pain Oozing noted from bilateral legs Complains of some neuropathic pain involving the leg Patient takes gabapentin at home  Physical Exam:  General: Elderly  female laying in bed in no acute distress Neck: normal, supple, no masses, no thyromegaly Respiratory: clear to auscultation bilaterally, no wheezing, no crackles. Normal respiratory effort. No accessory muscle use.  Cardiovascular: Regular rate and rhythm, no murmurs / rubs / gallops. No extremity edema. 2+ pedal pulses. No carotid bruits.  Abdomen: no tenderness, no masses palpated. No hepatosplenomegaly. Bowel sounds positive.  Musculoskeletal: no clubbing / cyanosis. No joint deformity upper and lower extremities. Good ROM, no contractures. Normal muscle tone.  Skin: Rash warm and tender to touch of bilateral lower extremity below the knees, with multiple shallow ulcers bilaterally with green-yellowish discharge Neurologic: CN 2-12 grossly intact. Sensation intact, DTR normal. Strength 5/5 in all 4.  Psychiatric: Normal judgment and insight. Alert and oriented x 3. Normal mood.              Vitals:   10/14/24 2029 10/15/24 0405 10/15/24 0806 10/15/24 1203  BP: (!) 164/83 (!) 161/71 (!) 164/75   Pulse: 80 86 83   Resp: 14 18 17    Temp: 97.8 F (36.6 C) 97.8 F (36.6 C) 98.1 F (36.7 C)   TempSrc:      SpO2: 93% 93% 91% 94%  Weight:      Height:        Data Reviewed: X-ray of both tibia and fibula reviewed showing severe right and left knee degenerative disease.  No findings of osteomyelitis    Latest Ref Rng & Units 10/15/2024    5:32 AM 10/14/2024    3:41 AM 10/04/2024   11:35 AM  CBC  WBC 4.0 - 10.5 K/uL 12.2  13.0  8.2   Hemoglobin 12.0 - 15.0 g/dL 86.9  86.1  86.4   Hematocrit 36.0 - 46.0 % 38.9  44.3  43.4   Platelets 150 - 400 K/uL 292  303  251        Latest Ref Rng & Units 10/14/2024    3:41 AM 10/04/2024   11:35 AM 08/02/2024    1:16 PM  BMP  Glucose 70 - 99 mg/dL 872  897  95   BUN 8 - 23 mg/dL 35  26  31   Creatinine 0.44 - 1.00 mg/dL 9.03  9.19  9.22   Sodium 135 - 145 mmol/L 141  139  141   Potassium 3.5 - 5.1 mmol/L 4.4  4.7  3.9   Chloride 98 -  111 mmol/L 103  104  107   CO2 22 - 32 mmol/L 27  26  24    Calcium 8.9 - 10.3 mg/dL 9.5  9.4  9.5       Time spent: 52 minutes  Author: Drue ONEIDA Potter, MD 10/15/2024 2:20 PM  For on call review [email or web address].

## 2024-10-15 NOTE — Progress Notes (Signed)
 Occupational Therapy Treatment Patient Details Name: Lydia Beard MRN: [id number] DOB: 01-Jun-1946 Today's Date: 10/15/2024   History of present illness 78 y.o. female with medical history significant of HTN, IIDM, HLD, DVT, morbid obesity presented with worsening of bilateral lower extremity rash swelling and pain.   OT comments  Pt seen for OT treatment on this date. Upon arrival to room pt asleep in bed, easily wakes to voice, agreeable to tx. Pt requires increased time and MINA for LE for bed mobility, pt able to STS from elevated bed height with significant anterior weight shifting and CGA. Pt step pivoted from EOB into BSC to void in toilet, required MAXA for standing pericare. Pt amb short distance into recliner required standing rest break, noted pt ambulated with forearms on the RW. Pt making fair progress toward goals, will continue to follow POC. Discharge recommendation have been updated to refect pt progress. appropriate.        If plan is discharge home, recommend the following:  Assistance with cooking/housework;Help with stairs or ramp for entrance;Assist for transportation;A lot of help with walking and/or transfers;A lot of help with bathing/dressing/bathroom   Equipment Recommendations  None recommended by OT    Recommendations for Other Services      Precautions / Restrictions Precautions Precautions: Fall Recall of Precautions/Restrictions: Intact Restrictions Weight Bearing Restrictions Per Provider Order: No       Mobility Bed Mobility Overal bed mobility: Needs Assistance Bed Mobility: Sit to Supine     Supine to sit: Min assist     General bed mobility comments: MINA BLE, extra time to complete    Transfers Overall transfer level: Needs assistance Equipment used: Rolling walker (2 wheels) Transfers: Sit to/from Stand, Bed to chair/wheelchair/BSC Sit to Stand: Contact guard assist     Step pivot transfers: Contact guard assist      General transfer comment: CGA for step pivot transfer from EOB to [facility], with use of RW often leaning on forarms during ambulation     Balance Overall balance assessment: Needs assistance Sitting-balance support: Feet supported Sitting balance-Leahy Scale: Good     Standing balance support: During functional activity, Bilateral upper extremity supported Standing balance-Leahy Scale: Fair                             ADL either performed or assessed with clinical judgement   ADL Overall ADL's : Needs assistance/impaired     Grooming: Wash/dry face;Wash/dry hands;Sitting;Set up           Upper Body Dressing : Sitting;Set up       Toilet Transfer: Rolling walker (2 wheels);BSC/3in1;Ambulation;Contact guard assist;Supervision/safety   Toileting- Clothing Manipulation and Hygiene: Maximal assistance;Sit to/from stand       Functional mobility during ADLs: Contact guard assist;Rolling walker (2 wheels);Supervision/safety General ADL Comments: Reports increasingly    Extremity/Trunk Assessment Upper Extremity Assessment Upper Extremity Assessment: Defer to OT evaluation   Lower Extremity Assessment Lower Extremity Assessment: Generalized weakness        Communication Communication Communication: No apparent difficulties   Cognition Arousal: Alert Behavior During Therapy: WFL for tasks assessed/performed Cognition: No apparent impairments             OT - Cognition Comments: A/Ox4                 Following commands: Intact        Cueing   Cueing Techniques: Verbal cues  Exercises Exercises: Other  exercises Other Exercises Other Exercises: Edu: Role of OT session, safe ADL completion, pericare edu           General Comments Bilateral LE wrapped pre/post session    Pertinent Vitals/ Pain       Pain Assessment Pain Assessment: 0-10 Pain Score: 6  Pain Location: B LEs Pain Descriptors / Indicators: Discomfort, Guarding,  Aching Pain Intervention(s): Limited activity within patient's tolerance, Repositioned  Home Living Family/patient expects to be discharged to:: Private residence Living Arrangements: Other relatives;Non-relatives/Friends (lives with grand daughter and her boyfriend) Available Help at Discharge: Family;Available PRN/intermittently Type of Home: House Home Access: Ramped entrance     Home Layout: One level     Bathroom Shower/Tub: Walk-in shower         Home Equipment: Cane - single point;Tub bench;Rolling Walker (2 wheels);Wheelchair - manual;BSC/3in1          Prior Functioning/Environment              Frequency  Min 2X/week        Progress Toward Goals  OT Goals(current goals can now be found in the care plan section)  Progress towards OT goals: Progressing toward goals  Acute Rehab OT Goals OT Goal Formulation: With patient Time For Goal Achievement: 10/28/24 Potential to Achieve Goals: Fair ADL Goals Pt Will Perform Grooming: with modified independence;standing Pt Will Perform Lower Body Dressing: with modified independence;sit to/from stand Pt Will Transfer to Toilet: with modified independence;ambulating Pt Will Perform Toileting - Clothing Manipulation and hygiene: with modified independence;sit to/from stand  Plan      Co-evaluation                 AM-PAC OT 6 Clicks Daily Activity     Outcome Measure   Help from another person eating meals?: None Help from another person taking care of personal grooming?: A Little Help from another person toileting, which includes using toliet, bedpan, or urinal?: A Little Help from another person bathing (including washing, rinsing, drying)?: A Little Help from another person to put on and taking off regular upper body clothing?: None Help from another person to put on and taking off regular lower body clothing?: A Lot 6 Click Score: 19    End of Session Equipment Utilized During Treatment: Rolling  walker (2 wheels)  OT Visit Diagnosis: Unsteadiness on feet (R26.81);Repeated falls (R29.6);Muscle weakness (generalized) (M62.81)   Activity Tolerance Patient tolerated treatment well   Patient Left with call bell/phone within reach;in chair   Nurse Communication Mobility status        Time: 8552-8484 OT Time Calculation (min): 28 min  Charges: OT General Charges $OT Visit: 1 Visit OT Treatments $Self Care/Home Management : 8-22 mins $Therapeutic Activity: 8-22 mins  Larraine Colas M.S. OTR/L  10/15/24, 4:31 PM

## 2024-10-15 NOTE — Evaluation (Signed)
 Physical Therapy Evaluation Patient Details Name: Lydia Beard MRN: [id number] DOB: 1946/07/24 Today's Date: 10/15/2024  History of Present Illness  78 y.o. female with medical history significant of HTN, IIDM, HLD, DVT, morbid obesity presented with worsening of bilateral lower extremity rash swelling and pain.  Clinical Impression  Patient admitted with the above. PTA, patient lives with family members and was modI using w/c for most mobility and able transfer and ambulate short distances with RW vs SPC. Patient reports family that lives with her is not helpful in her care. Patient presents with weakness, impaired balance, and decreased activity tolerance. Able to complete sit to stand and step pivot transfer with supervision and RW. Returned to supine at end of session with minA for BLE management. Patient concerned about returning home and caring for her self independently especially with current wounds. Patient will benefit from skilled PT services during acute stay to address listed deficits. Patient will benefit from ongoing therapy at discharge to maximize functional independence and safety.         If plan is discharge home, recommend the following: A lot of help with bathing/dressing/bathroom;Assistance with cooking/housework;Help with stairs or ramp for entrance;Assist for transportation   Can travel by private vehicle   Yes    Equipment Recommendations None recommended by PT  Recommendations for Other Services       Functional Status Assessment Patient has had a recent decline in their functional status and demonstrates the ability to make significant improvements in function in a reasonable and predictable amount of time.     Precautions / Restrictions Precautions Precautions: Fall Recall of Precautions/Restrictions: Intact Restrictions Weight Bearing Restrictions Per Provider Order: No      Mobility  Bed Mobility Overal bed mobility: Needs Assistance Bed  Mobility: Sit to Supine       Sit to supine: Min assist   General bed mobility comments: assistance for B LEs    Transfers Overall transfer level: Needs assistance Equipment used: Rolling [place] (2 wheels), 1 person hand held assist Transfers: Sit to/from Stand, Bed to chair/wheelchair/BSC Sit to Stand: Contact guard assist   Step pivot transfers: Supervision       General transfer comment: increased time and effort. Able to transfer to/from [facility]    Ambulation/Gait                  Stairs            Wheelchair Mobility     Tilt Bed    Modified Rankin (Stroke Patients Only)       Balance Overall balance assessment: Needs assistance Sitting-balance support: Feet supported Sitting balance-Leahy Scale: Good     Standing balance support: During functional activity, Bilateral upper extremity supported Standing balance-Leahy Scale: Fair                               Pertinent Vitals/Pain Pain Assessment Pain Assessment: Faces Faces Pain Scale: Hurts little more Pain Location: B LEs Pain Descriptors / Indicators: Discomfort, Guarding, Aching Pain Intervention(s): Limited activity within patient's tolerance, Monitored during session, Repositioned    Home Living Family/patient expects to be discharged to:: Private residence Living Arrangements: Other relatives;Non-relatives/Friends (lives with grand daughter and her boyfriend) Available Help at Discharge: Family;Available PRN/intermittently Type of Home: House Home Access: Ramped entrance       Home Layout: One level Home Equipment: Cane - single point;Tub bench;Rolling Adama Ferber (2 wheels);Wheelchair - manual;BSC/3in1  Prior Function Prior Level of Function : Needs assist;Independent/Modified Independent             Mobility Comments: Uses manual w/c for mobility most of the time and RW and cane if needed for transfers or to get into bathroom. ADLs Comments: Pt has an aide  that comes 1x/wk to help with IADLs in home. She is Ind with self care tasks at baseline.     Extremity/Trunk Assessment   Upper Extremity Assessment Upper Extremity Assessment: Defer to OT evaluation    Lower Extremity Assessment Lower Extremity Assessment: Generalized weakness       Communication   Communication Communication: No apparent difficulties    Cognition Arousal: Alert Behavior During Therapy: WFL for tasks assessed/performed   PT - Cognitive impairments: No apparent impairments                         Following commands: Intact       Cueing Cueing Techniques: Verbal cues     General Comments      Exercises     Assessment/Plan    PT Assessment Patient needs continued PT services  PT Problem List Decreased strength;Decreased activity tolerance;Decreased balance;Decreased mobility;Decreased knowledge of precautions;Cardiopulmonary status limiting activity;Pain       PT Treatment Interventions DME instruction;Gait training;Functional mobility training;Therapeutic activities;Therapeutic exercise;Balance training;Patient/family education    PT Goals (Current goals can be found in the Care Plan section)  Acute Rehab PT Goals Patient Stated Goal: to get stronger and take care of myself PT Goal Formulation: With patient Time For Goal Achievement: 10/29/24 Potential to Achieve Goals: Fair    Frequency Min 2X/week     Co-evaluation               AM-PAC PT 6 Clicks Mobility  Outcome Measure Help needed turning from your back to your side while in a flat bed without using bedrails?: A Little Help needed moving from lying on your back to sitting on the side of a flat bed without using bedrails?: A Little Help needed moving to and from a bed to a chair (including a wheelchair)?: A Little Help needed standing up from a chair using your arms (e.g., wheelchair or bedside chair)?: A Little Help needed to walk in hospital room?: A Lot Help  needed climbing 3-5 steps with a railing? : Total 6 Click Score: 15    End of Session   Activity Tolerance: Patient tolerated treatment well Patient left: in bed;with bed alarm set;with call bell/phone within reach;with nursing/sitter in room Nurse Communication: Mobility status PT Visit Diagnosis: Unsteadiness on feet (R26.81);Other abnormalities of gait and mobility (R26.89);Muscle weakness (generalized) (M62.81);Difficulty in walking, not elsewhere classified (R26.2);Pain Pain - part of body: Leg    Time: 9053-9040 PT Time Calculation (min) (ACUTE ONLY): 13 min   Charges:   PT Evaluation $PT Eval Moderate Complexity: 1 Mod   PT General Charges $$ ACUTE PT VISIT: 1 Visit         Maryanne Finder, PT, DPT Physical Therapist - [facility]  [facility]   Wade Asebedo A Lorece Keach 10/15/2024, 1:07 PM

## 2024-10-16 DIAGNOSIS — L03119 Cellulitis of unspecified part of limb: Secondary | ICD-10-CM | POA: Diagnosis not present

## 2024-10-16 LAB — CBC WITH DIFFERENTIAL/PLATELET
Abs Immature Granulocytes: 0.08 K/uL — ABNORMAL HIGH (ref 0.00–0.07)
Basophils Absolute: 0.1 K/uL (ref 0.0–0.1)
Basophils Relative: 1 %
Eosinophils Absolute: 0.1 K/uL (ref 0.0–0.5)
Eosinophils Relative: 1 %
HCT: 40 % (ref 36.0–46.0)
Hemoglobin: 12.7 g/dL (ref 12.0–15.0)
Immature Granulocytes: 1 %
Lymphocytes Relative: 23 %
Lymphs Abs: 2.6 K/uL (ref 0.7–4.0)
MCH: 28.7 pg (ref 26.0–34.0)
MCHC: 31.8 g/dL (ref 30.0–36.0)
MCV: 90.5 fL (ref 80.0–100.0)
Monocytes Absolute: 0.9 K/uL (ref 0.1–1.0)
Monocytes Relative: 8 %
Neutro Abs: 7.3 K/uL (ref 1.7–7.7)
Neutrophils Relative %: 66 %
Platelets: 294 K/uL (ref 150–400)
RBC: 4.42 MIL/uL (ref 3.87–5.11)
RDW: 12.8 % (ref 11.5–15.5)
WBC: 11 K/uL — ABNORMAL HIGH (ref 4.0–10.5)
nRBC: 0 % (ref 0.0–0.2)

## 2024-10-16 LAB — BASIC METABOLIC PANEL WITH GFR
Anion gap: 11 (ref 5–15)
BUN: 35 mg/dL — ABNORMAL HIGH (ref 8–23)
CO2: 26 mmol/L (ref 22–32)
Calcium: 9.2 mg/dL (ref 8.9–10.3)
Chloride: 106 mmol/L (ref 98–111)
Creatinine, Ser: 0.8 mg/dL (ref 0.44–1.00)
GFR, Estimated: >60 mL/min (ref >60)
Glucose, Bld: 100 mg/dL — ABNORMAL HIGH (ref 70–99)
Potassium: 4.2 mmol/L (ref 3.5–5.1)
Sodium: 143 mmol/L (ref 135–145)

## 2024-10-16 LAB — GLUCOSE, CAPILLARY
Glucose-Capillary: 132 mg/dL — ABNORMAL HIGH (ref 70–99)
Glucose-Capillary: 98 mg/dL (ref 70–99)
Glucose-Capillary: 102 mg/dL — ABNORMAL HIGH (ref 70–99)
Glucose-Capillary: 133 mg/dL — ABNORMAL HIGH (ref 70–99)

## 2024-10-16 MED ORDER — GABAPENTIN 300 MG PO CAPS
600.0000 mg | ORAL_CAPSULE | Freq: Three times a day (TID) | ORAL | Status: DC
  Administered 2024-10-16 – 2024-10-18 (×7): 600 mg via ORAL
  Filled 2024-10-16 (×7): qty 2

## 2024-10-16 NOTE — TOC Progression Note (Signed)
 Transition of Care [facility]) - Progression Note    Patient Details  Name: Lydia Beard MRN: [id number] Date of Birth: 11/07/1946  Transition of Care [facility]) CM/SW Contact  Alvaro Louder, [place] Phone Number: 10/16/2024, 1:59 PM  Clinical Narrative:   LCSWA met the patient at the bedside to discuss SNF offers. Patient indicated that she would like to go with [facility]. Per Medicare A and B policy patient must have 2 more inpatient days before she can can admit to the facility.  TOC to follow for discharge.                       Expected Discharge Plan and Services                                               Social Drivers of Health (SDOH) Interventions SDOH Screenings   Food Insecurity: No Food Insecurity (10/14/2024)  Housing: Low Risk  (10/14/2024)  Transportation Needs: Unmet Transportation Needs (10/14/2024)  Utilities: Not At Risk (10/14/2024)  Depression (PHQ2-9): Low Risk  (06/04/2019)  Financial Resource Strain: Low Risk  (10/07/2024)   Received from [facility]  Physical Activity: Inactive (10/07/2024)   Received from [facility]  Social Connections: Socially Isolated (10/14/2024)  Stress: Stress Concern Present (10/07/2024)   Received from [facility]  Tobacco Use: Medium Risk (10/07/2024)   Received from [facility] Literacy: Low Risk  (10/07/2024)   Received from [facility]    Readmission Risk Interventions     No data to display

## 2024-10-16 NOTE — TOC Initial Note (Signed)
 Transition of Care [facility]) - Initial/Assessment Note    Patient Details  Name: Lydia Beard MRN: [id number] Date of Birth: [birth year]  Transition of Care [facility]) CM/SW Contact:    Alvaro Louder, LCSW Phone Number: 10/16/2024, 9:13 AM  Clinical Narrative:     Per Chart review patient from home. PCP is Levorn Snide. Patient is deciding between SNF and HH. LCSWA Faxed out information to SNF's in [place]. LCSWA will present Facilities to patient at the bedside.         TOC to follow for discharge        Patient Goals and CMS Choice            Expected Discharge Plan and Services                                              Prior Living Arrangements/Services                       Activities of Daily Living   ADL Screening (condition at time of admission) Independently performs ADLs?: No Does the patient have a NEW difficulty with bathing/dressing/toileting/self-feeding that is expected to last >3 days?: Yes (Initiates electronic notice to provider for possible OT consult) Does the patient have a NEW difficulty with getting in/out of bed, walking, or climbing stairs that is expected to last >3 days?: Yes (Initiates electronic notice to provider for possible PT consult) Does the patient have a NEW difficulty with communication that is expected to last >3 days?: No Is the patient deaf or have difficulty hearing?: No Does the patient have difficulty seeing, even when wearing glasses/contacts?: Yes Does the patient have difficulty concentrating, remembering, or making decisions?: No  Permission Sought/Granted                  Emotional Assessment              Admission diagnosis:  Cellulitis [L03.90] Cellulitis of lower leg [L03.119] Patient Active Problem List   Diagnosis Date Noted   Cellulitis 10/14/2024   Primary osteoarthritis of left hip 12/04/2018   Allergic rhinitis 04/04/2018   Gait instability 09/28/2017   Lower  extremity edema 05/25/2017   Leukocytosis 11/24/2016   Benign essential HTN 10/05/2016   Depression 09/17/2015   Prediabetes 09/17/2015   OA (osteoarthritis) of knee 09/16/2015   GERD (gastroesophageal reflux disease) 09/16/2015   Body mass index (BMI) of 50-59.9 in adult (HCC) 06/12/2014   PCP:  Snide Levorn ORN, NP Pharmacy:   [facility] [phone] [place],  - [address] AT [facility] [address] [place] [address] Phone: [phone] Fax: [phone]  [facility] - [place], [place] - [address] [place] [address] Phone: [phone] Fax: [phone]  [id number] - [place], [place] - [address] AT [facility] OF [address] [place] [address] [place] [address] Phone: [phone] Fax: [phone]     Social Drivers of Health (SDOH) Social History: SDOH Screenings   Food Insecurity: No Food Insecurity (10/14/2024)  Housing: Low Risk  (10/14/2024)  Transportation Needs: Unmet Transportation Needs (10/14/2024)  Utilities: Not At Risk (10/14/2024)  Depression (PHQ2-9): Low Risk  (06/04/2019)  Financial Resource Strain: Low Risk  (  10/07/2024)   [facility]  Physical Activity: Inactive (10/07/2024)   Received from [facility]  Social Connections: Socially Isolated (10/14/2024)  Stress: Stress Concern Present (10/07/2024)   Received from [facility]  Tobacco Use: Medium Risk (10/07/2024)   Received from [facility] Literacy: Low Risk  (10/07/2024)   Received from [facility]   SDOH Interventions:     Readmission Risk Interventions     No data to display

## 2024-10-16 NOTE — Progress Notes (Signed)
 Occupational Therapy Treatment Patient Details Name: Lydia Beard MRN: [id number] DOB: [birth year] Today's Date: 10/16/2024   History of present illness 78 y.o. female with medical history significant of HTN, IIDM, HLD, DVT, morbid obesity presented with worsening of bilateral lower extremity rash swelling and pain.   OT comments  Upon entering the room, pt standing from [facility] with staff assistance and use of RW. Pt transitioned to care of OT and taking several steps to bed with CGA. Pt is very fatigued as nursing staff report she has been up several times today with lasix  to use bathroom. Pt sitting on EOB and needing assistance to get B LEs onto bed and assistance to position pt in center of bed. OT provided pt with red resistive theraband and demonstrated a variety of exercises for her to do on her own. She does demonstrate a few exercises back to therapist to demonstrate understanding and band left for her to utilize.OT discussed about different time of day to see pt where she would not be quite so fatigued. Call bell and all needed items within reach upon exiting the room.       If plan is discharge home, recommend the following:  Assistance with cooking/housework;Help with stairs or ramp for entrance;Assist for transportation;A lot of help with walking and/or transfers;A lot of help with bathing/dressing/bathroom   Equipment Recommendations  None recommended by OT (defer to next venue of care)       Precautions / Restrictions Precautions Precautions: Fall Recall of Precautions/Restrictions: Intact       Mobility Bed Mobility Overal bed mobility: Needs Assistance Bed Mobility: Sit to Supine       Sit to supine: Min assist   General bed mobility comments: MINA BLE, extra time to complete    Transfers   Equipment used: Rolling walker (2 wheels) Transfers: Sit to/from Stand Sit to Stand: Contact guard assist     Step pivot transfers: Contact guard assist            Balance Overall balance assessment: Needs assistance Sitting-balance support: Feet supported Sitting balance-Leahy Scale: Good     Standing balance support: During functional activity, Bilateral upper extremity supported Standing balance-Leahy Scale: Fair                             ADL either performed or assessed with clinical judgement   ADL Overall ADL's : Needs assistance/impaired                         Toilet Transfer: Rolling walker (2 wheels);BSC/3in1;Ambulation;Contact guard assist;Supervision/safety                  Extremity/Trunk Assessment Upper Extremity Assessment Upper Extremity Assessment: Defer to OT evaluation   Lower Extremity Assessment Lower Extremity Assessment: Generalized weakness   Cervical / Trunk Assessment Cervical / Trunk Assessment: Normal    Vision Patient Visual Report: No change from baseline           Communication Communication Communication: No apparent difficulties   Cognition Arousal: Alert Behavior During Therapy: WFL for tasks assessed/performed Cognition: No apparent impairments             OT - Cognition Comments: A/Ox4                 Following commands: Intact        Cueing   Cueing Techniques: Verbal cues  Pertinent Vitals/ Pain       Pain Assessment Pain Assessment: Faces Faces Pain Scale: Hurts little more Pain Location: L hip Pain Descriptors / Indicators: Discomfort, Guarding, Aching Pain Intervention(s): Limited activity within patient's tolerance, Monitored during session, Repositioned         Frequency  Min 2X/week        Progress Toward Goals  OT Goals(current goals can now be found in the care plan section)  Progress towards OT goals: Progressing toward goals  Acute Rehab OT Goals Patient Stated Goal: to go home OT Goal Formulation: With patient Time For Goal Achievement: 10/28/24 Potential to Achieve Goals: Fair  Plan          AM-PAC OT 6 Clicks Daily Activity     Outcome Measure   Help from another person eating meals?: None Help from another person taking care of personal grooming?: A Little Help from another person toileting, which includes using toliet, bedpan, or urinal?: A Little Help from another person bathing (including washing, rinsing, drying)?: A Little Help from another person to put on and taking off regular upper body clothing?: None Help from another person to put on and taking off regular lower body clothing?: A Lot 6 Click Score: 19    End of Session Equipment Utilized During Treatment: Rolling walker (2 wheels)  OT Visit Diagnosis: Unsteadiness on feet (R26.81);Repeated falls (R29.6);Muscle weakness (generalized) (M62.81)   Activity Tolerance Patient tolerated treatment well   Patient Left with call bell/phone within reach;in bed;with bed alarm set   Nurse Communication Mobility status        Time: 8499-8476 OT Time Calculation (min): 23 min  Charges: OT General Charges $OT Visit: 1 Visit OT Treatments $Self Care/Home Management : 8-22 mins $Therapeutic Activity: 8-22 mins  Izetta Claude, MS, OTR/L , CBIS ascom [phone]  10/16/24, 3:41 PM

## 2024-10-16 NOTE — Care Management Important Message (Signed)
 Important Message  Patient Details  Name: Lydia Beard MRN: [id number] Date of Birth: September 28, 1946   Important Message Given:  Yes - Medicare IM     Petrea Fredenburg W, CMA 10/16/2024, 10:57 AM

## 2024-10-16 NOTE — Progress Notes (Signed)
 Physical Therapy Treatment Patient Details Name: Lydia Beard MRN: [id number] DOB: 27-Dec-1945 Today's Date: 10/16/2024   History of Present Illness 78 y.o. female with medical history significant of HTN, IIDM, HLD, DVT, morbid obesity presented with worsening of bilateral lower extremity rash swelling and pain.    PT Comments  Patient seen for PT session focused on OOB mobility tolerance. Patient required CGA/minA for safety during transfers and ambulation in room for distance of 10 feet using RW. Pt requires vc for upright posture due to leaning heavy on L UE using forearm on RW during standing and ambulation. Pt stated that this is due to increased and varying levels of L hip pain. Tolerated session fairly with no signs of exertion or distress. Main limiting factors today were LE weakness, increased non-functional body mass relative to functional mobility and L hip pain/weakness. Interventions aimed at improving ambulation distance. Continued skilled PT recommended to progress toward functional goals and support discharge readiness.    If plan is discharge home, recommend the following: A lot of help with bathing/dressing/bathroom;Assistance with cooking/housework;Help with stairs or ramp for entrance;Assist for transportation   Can travel by private vehicle     Yes  Equipment Recommendations  None recommended by PT    Recommendations for Other Services       Precautions / Restrictions Precautions Precautions: Fall Recall of Precautions/Restrictions: Intact Restrictions Weight Bearing Restrictions Per Provider Order: No     Mobility  Bed Mobility                    Transfers Overall transfer level: Needs assistance Equipment used: Rolling walker (2 wheels) Transfers: Sit to/from Stand Sit to Stand: Contact guard assist           General transfer comment: Sit <> stand x2 for short ambulation bouts using RW; leaning heavy on L UE using forearm on RW during  standing and ambulation    Ambulation/Gait Ambulation/Gait assistance: Min assist, Contact guard assist Gait Distance (Feet): 10 Feet Assistive device: Rolling walker (2 wheels)         General Gait Details: leaning heavy on L UE using forearm on RW during standing and ambulation   Stairs             Wheelchair Mobility     Tilt Bed    Modified Rankin (Stroke Patients Only)       Balance Overall balance assessment: Needs assistance Sitting-balance support: Feet supported Sitting balance-Leahy Scale: Good     Standing balance support: During functional activity, Bilateral upper extremity supported Standing balance-Leahy Scale: Fair Standing balance comment: heavy lean on RW on L side due to swelling and hip pain in L hip                            Communication Communication Communication: No apparent difficulties  Cognition Arousal: Alert Behavior During Therapy: WFL for tasks assessed/performed   PT - Cognitive impairments: No apparent impairments                         Following commands: Intact      Cueing Cueing Techniques: Verbal cues  Exercises      General Comments        Pertinent Vitals/Pain Pain Assessment Pain Assessment: 0-10 Pain Score: 6  Pain Location: B LEs more so pain in L hip Pain Descriptors / Indicators: Discomfort, Guarding, Aching Pain Intervention(s): Limited  activity within patient's tolerance, Repositioned, Monitored during session    Home Living                          Prior Function            PT Goals (current goals can now be found in the care plan section) Acute Rehab PT Goals Patient Stated Goal: to get stronger and take care of myself PT Goal Formulation: With patient Time For Goal Achievement: 10/29/24 Potential to Achieve Goals: Fair    Frequency    Min 2X/week      PT Plan      Co-evaluation              AM-PAC PT 6 Clicks Mobility   Outcome  Measure  Help needed turning from your back to your side while in a flat bed without using bedrails?: A Little Help needed moving from lying on your back to sitting on the side of a flat bed without using bedrails?: A Little Help needed moving to and from a bed to a chair (including a wheelchair)?: A Little Help needed standing up from a chair using your arms (e.g., wheelchair or bedside chair)?: A Little Help needed to walk in hospital room?: A Lot Help needed climbing 3-5 steps with a railing? : Total 6 Click Score: 15    End of Session   Activity Tolerance: Patient tolerated treatment well Patient left: with bed alarm set;in chair Nurse Communication: Mobility status PT Visit Diagnosis: Unsteadiness on feet (R26.81);Other abnormalities of gait and mobility (R26.89);Muscle weakness (generalized) (M62.81);Difficulty in walking, not elsewhere classified (R26.2);Pain Pain - Right/Left: Left Pain - part of body: Leg     Time: 1325-1345 PT Time Calculation (min) (ACUTE ONLY): 20 min  Charges:    $Therapeutic Activity: 8-22 mins PT General Charges $$ ACUTE PT VISIT: 1 Visit                     Sherlean Lesches DPT, PT     Sherlean A Dmarcus Decicco 10/16/2024, 1:53 PM

## 2024-10-16 NOTE — Plan of Care (Signed)
  Problem: Education: Goal: Knowledge of General Education information will improve Description: Including pain rating scale, medication(s)/side effects and non-pharmacologic comfort measures Outcome: Progressing   Problem: Clinical Measurements: Goal: Diagnostic test results will improve Outcome: Progressing   Problem: Activity: Goal: Risk for activity intolerance will decrease Outcome: Progressing   Problem: Nutrition: Goal: Adequate nutrition will be maintained Outcome: Progressing   Problem: Coping: Goal: Level of anxiety will decrease Outcome: Progressing   Problem: Pain Managment: Goal: General experience of comfort will improve and/or be controlled Outcome: Progressing   Problem: Education: Goal: Knowledge of General Education information will improve Description: Including pain rating scale, medication(s)/side effects and non-pharmacologic comfort measures Outcome: Progressing   Problem: Clinical Measurements: Goal: Diagnostic test results will improve Outcome: Progressing   Problem: Activity: Goal: Risk for activity intolerance will decrease Outcome: Progressing   Problem: Nutrition: Goal: Adequate nutrition will be maintained Outcome: Progressing   Problem: Coping: Goal: Level of anxiety will decrease Outcome: Progressing   Problem: Pain Managment: Goal: General experience of comfort will improve and/or be controlled Outcome: Progressing

## 2024-10-16 NOTE — NC FL2 (Signed)
 [place]  MEDICAID FL2 LEVEL OF CARE FORM     IDENTIFICATION  Patient Name: Lydia Beard Birthdate: March 26, 1946 Sex: female Admission Date (Current Location): 10/14/2024  [facility] and [place] Number:  Chiropodist and Address:  [facility], [address] [place], [place] [address]      Provider Number: [id number]  Attending Physician Name and Address:  Jerelene Critchley, MD  Relative Name and Phone Number:       Current Level of Care: Hospital Recommended Level of Care: Skilled Nursing Facility Prior Approval Number:    Date Approved/Denied:   PASRR Number: [id number]  Discharge Plan: SNF    Current Diagnoses: Patient Active Problem List   Diagnosis Date Noted   Cellulitis 10/14/2024   Primary osteoarthritis of left hip 12/04/2018   Allergic rhinitis 04/04/2018   Gait instability 09/28/2017   Lower extremity edema 05/25/2017   Leukocytosis 11/24/2016   Benign essential HTN 10/05/2016   Depression 09/17/2015   Prediabetes 09/17/2015   OA (osteoarthritis) of knee 09/16/2015   GERD (gastroesophageal reflux disease) 09/16/2015   Body mass index (BMI) of 50-59.9 in adult (HCC) 06/12/2014    Orientation RESPIRATION BLADDER Height & Weight     Self, Time, Situation, Place  Normal Continent Weight: (!) 356 lb (161.5 kg) Height:  5' 6 (167.6 cm)  BEHAVIORAL SYMPTOMS/MOOD NEUROLOGICAL BOWEL NUTRITION STATUS      Continent Diet (Heart)  AMBULATORY STATUS COMMUNICATION OF NEEDS Skin   Extensive Assist Verbally Skin abrasions (Pretibial right and left)                       Personal Care Assistance Level of Assistance  Bathing, Feeding, Dressing Bathing Assistance: Limited assistance Feeding assistance: Independent Dressing Assistance: Limited assistance     Functional Limitations Info  Sight, Hearing, Speech Sight Info: Impaired Hearing Info: Adequate Speech Info: Adequate    SPECIAL CARE FACTORS FREQUENCY  PT  (By licensed PT), OT (By licensed OT)     PT Frequency: 5x/week OT Frequency: 5x/week            Contractures      Additional Factors Info  Code Status, Allergies Code Status Info: Full Allergies Info: Codeine, Erythromycin, Penicillins           Current Medications (10/16/2024):  This is the current hospital active medication list Current Facility-Administered Medications  Medication Dose Route Frequency Provider Last Rate Last Admin   acetaminophen  (TYLENOL ) tablet 650 mg  650 mg Oral Q6H PRN Duncan, Hazel V, MD   650 mg at 10/15/24 1203   Or   acetaminophen  (TYLENOL ) suppository 650 mg  650 mg Rectal Q6H PRN Duncan, Hazel V, MD       ceFEPIme (MAXIPIME) 2 g in sodium chloride  0.9 % 100 mL IVPB  2 g Intravenous Q8H Nazari, Walid A, RPH 200 mL/hr at 10/16/24 0548 2 g at 10/16/24 0548   furosemide  (LASIX ) injection 40 mg  40 mg Intravenous Daily Laurita Manor T, MD   40 mg at 10/15/24 9082   gabapentin (NEURONTIN) capsule 300 mg  300 mg Oral TID Djan, Prince T, MD   300 mg at 10/15/24 2121   insulin aspart (novoLOG) injection 0-5 Units  0-5 Units Subcutaneous QHS Dorinda Homans T, MD       insulin aspart (novoLOG) injection 0-9 Units  0-9 Units Subcutaneous TID WC Djan, Prince T, MD   1 Units at 10/15/24 1655   insulin glargine-yfgn (SEMGLEE) injection 10  Units  10 Units Subcutaneous QHS Dorinda Homans T, MD   10 Units at 10/15/24 2122   ketorolac  (TORADOL ) 15 MG/ML injection 15 mg  15 mg Intravenous Q6H PRN Laurita Manor T, MD   15 mg at 10/16/24 0024   meclizine  (ANTIVERT ) tablet 25 mg  25 mg Oral TID PRN Laurita Manor DASEN, MD       ondansetron  (ZOFRAN ) tablet 4 mg  4 mg Oral Q6H PRN Duncan, Hazel V, MD       Or   ondansetron  (ZOFRAN ) injection 4 mg  4 mg Intravenous Q6H PRN Duncan, Hazel V, MD       oxyCODONE  (Oxy IR/ROXICODONE ) immediate release tablet 5 mg  5 mg Oral Q6H PRN Laurita Manor T, MD   5 mg at 10/16/24 0420   pantoprazole (PROTONIX) EC tablet 40 mg  40 mg Oral Daily Zhang,  Ping T, MD   40 mg at 10/15/24 0919   rivaroxaban  (XARELTO ) tablet 20 mg  20 mg Oral Q supper Laurita Manor T, MD   20 mg at 10/15/24 1654   tiZANidine  (ZANAFLEX ) tablet 4 mg  4 mg Oral QHS Djan, Prince T, MD       vancomycin (VANCOREADY) IVPB 1750 mg/350 mL  1,750 mg Intravenous Q24H Nazari, Walid A, RPH 175 mL/hr at 10/15/24 0944 1,750 mg at 10/15/24 0944     Discharge Medications: Please see discharge summary for a list of discharge medications.  Relevant Imaging Results:  Relevant Lab Results:   Additional Information SSN: [id number]  Alvaro Louder, LCSW

## 2024-10-16 NOTE — Progress Notes (Signed)
 Progress Note   Patient: Lydia Beard FMW:[id number] DOB: [birth year] DOA: 10/14/2024     2 DOS: the patient was seen and examined on 10/16/2024   Brief hospital course:  SHATERICA MCCLATCHY is a 78 y.o. female with medical history significant of HTN, IIDM, HLD, DVT, morbid obesity presented with worsening of bilateral lower extremity rash swelling and pain.  Patient found to have bilateral lower extremity purulent cellulitis.   Assessment and Plan:  Cellulitis of bilateral lower extremity, failed outpatient management - Cellulitis involves more than one third of surface area of bilateral extremities, failed outpatient management, indication for IV antibiotics. - Leukocytosis improving - Continue broad-spectrum antibiotics including vancomycin and cefepime, given the increased risk of Pseudomonas infection in diabetes with chronic wounds Wound care consulted Lower extremity elevation Follow-up on blood cultures   Type 2 diabetes mellitus - HbA1c 5.7 - Metformin  discontinued while inpatient - SSI, monitor glucose  History of DVT - Continue Xarelto    Essential hypertension - Hold lisinopril  - resume as BP tolerates   Venous stasis Peripheral edema - With worsening of lower extremity edema, changed p.o. Lasix  to IV Lasix  - Other DDx, normal LVEF and right heart function on recent echocardiogram.   Morbid obesity - BMI= 57 Counseled on weight loss with diet and exercise when medically stable  Peripheral neuropathy- inc gabapentin 600 TID (has been taking 600 daily at home)   DVT prophylaxis: Xarelto   Code Status: Full code  Family Communication: None present at the bedside Disposition Plan: Pending clinical course    Subjective:  Patient seen and examined at bedside this morning Complains of neuropathic pain, increase gabapentin Erythema and purulence of lower extremities improving  Physical Exam:  General: Elderly female laying in bed in no acute  distress Neck: normal, supple, no masses, no thyromegaly Respiratory: clear to auscultation bilaterally, no wheezing, no crackles. Normal respiratory effort. No accessory muscle use.  Cardiovascular: Regular rate and rhythm, no murmurs / rubs / gallops. No extremity edema. 2+ pedal pulses. No carotid bruits.  Abdomen: no tenderness, no masses palpated. No hepatosplenomegaly. Bowel sounds positive.  Musculoskeletal: no clubbing / cyanosis. No joint deformity upper and lower extremities. Good ROM, no contractures. Normal muscle tone.  Skin: Rash warm and tender to touch of bilateral lower extremity below the knees, with multiple shallow ulcers bilaterally Neurologic: CN 2-12 grossly intact. Sensation intact, DTR normal. Strength 5/5 in all 4.  Psychiatric: Normal judgment and insight. Alert and oriented x 3. Normal mood.       Vitals:   10/15/24 1623 10/15/24 2030 10/16/24 0526 10/16/24 0737  BP: (!) 148/78 (!) 106/52 136/75 (!) 106/47  Pulse: 85 73 81 78  Resp: 17 18 18 17   Temp: 98.4 F (36.9 C) 98 F (36.7 C) 98 F (36.7 C) 98.1 F (36.7 C)  TempSrc:  Oral    SpO2: 92% 97% 95% 93%  Weight:      Height:        Data Reviewed: X-ray of both tibia and fibula reviewed showing severe right and left knee degenerative disease.  No findings of osteomyelitis    Latest Ref Rng & Units 10/16/2024    3:56 AM 10/15/2024    5:32 AM 10/14/2024    3:41 AM  CBC  WBC 4.0 - 10.5 K/uL 11.0  12.2  13.0   Hemoglobin 12.0 - 15.0 g/dL 87.2  86.9  86.1   Hematocrit 36.0 - 46.0 % 40.0  38.9  44.3   Platelets 150 -  400 K/uL 294  292  303        Latest Ref Rng & Units 10/16/2024    3:56 AM 10/14/2024    3:41 AM 10/04/2024   11:35 AM  BMP  Glucose 70 - 99 mg/dL 899  872  897   BUN 8 - 23 mg/dL 35  35  26   Creatinine 0.44 - 1.00 mg/dL 9.19  9.03  9.19   Sodium 135 - 145 mmol/L 143  141  139   Potassium 3.5 - 5.1 mmol/L 4.2  4.4  4.7   Chloride 98 - 111 mmol/L 106  103  104   CO2 22 - 32  mmol/L 26  27  26    Calcium 8.9 - 10.3 mg/dL 9.2  9.5  9.4       Time spent: 55 minutes  Author: Laree Lock, MD 10/16/2024 8:33 AM  For on call review [email or web address].

## 2024-10-16 NOTE — Plan of Care (Signed)
  Problem: Skin Integrity: Goal: Risk for impaired skin integrity will decrease Outcome: Progressing   Problem: Fluid Volume: Goal: Ability to maintain a balanced intake and output will improve Outcome: Progressing   Problem: Pain Managment: Goal: General experience of comfort will improve and/or be controlled Outcome: Progressing   Problem: Activity: Goal: Risk for activity intolerance will decrease Outcome: Progressing

## 2024-10-17 ENCOUNTER — Other Ambulatory Visit: Payer: Self-pay

## 2024-10-17 ENCOUNTER — Encounter: Payer: Self-pay | Admitting: Internal Medicine

## 2024-10-17 DIAGNOSIS — L03119 Cellulitis of unspecified part of limb: Secondary | ICD-10-CM | POA: Diagnosis not present

## 2024-10-17 LAB — GLUCOSE, CAPILLARY
Glucose-Capillary: 101 mg/dL — ABNORMAL HIGH (ref 70–99)
Glucose-Capillary: 100 mg/dL — ABNORMAL HIGH (ref 70–99)
Glucose-Capillary: 108 mg/dL — ABNORMAL HIGH (ref 70–99)
Glucose-Capillary: 184 mg/dL — ABNORMAL HIGH (ref 70–99)

## 2024-10-17 LAB — BASIC METABOLIC PANEL WITH GFR
Anion gap: 8 (ref 5–15)
BUN: 36 mg/dL — ABNORMAL HIGH (ref 8–23)
CO2: 25 mmol/L (ref 22–32)
Calcium: 8.7 mg/dL — ABNORMAL LOW (ref 8.9–10.3)
Chloride: 107 mmol/L (ref 98–111)
Creatinine, Ser: 0.79 mg/dL (ref 0.44–1.00)
GFR, Estimated: >60 mL/min (ref >60)
Glucose, Bld: 112 mg/dL — ABNORMAL HIGH (ref 70–99)
Potassium: 4.2 mmol/L (ref 3.5–5.1)
Sodium: 140 mmol/L (ref 135–145)

## 2024-10-17 LAB — CBC
HCT: 38.9 % (ref 36.0–46.0)
Hemoglobin: 12.5 g/dL (ref 12.0–15.0)
MCH: 28.9 pg (ref 26.0–34.0)
MCHC: 32.1 g/dL (ref 30.0–36.0)
MCV: 89.8 fL (ref 80.0–100.0)
Platelets: 329 K/uL (ref 150–400)
RBC: 4.33 MIL/uL (ref 3.87–5.11)
RDW: 12.8 % (ref 11.5–15.5)
WBC: 9.7 K/uL (ref 4.0–10.5)
nRBC: 0 % (ref 0.0–0.2)

## 2024-10-17 MED ORDER — FUROSEMIDE 10 MG/ML IJ SOLN
40.0000 mg | Freq: Two times a day (BID) | INTRAMUSCULAR | Status: AC
  Administered 2024-10-17 – 2024-10-18 (×2): 40 mg via INTRAVENOUS
  Filled 2024-10-17 (×2): qty 4

## 2024-10-17 NOTE — Progress Notes (Signed)
 Progress Note   Patient: Lydia Beard FMW:[id number] DOB: Aug 15, 1946 DOA: 10/14/2024     3 DOS: the patient was seen and examined on 10/17/2024   Brief hospital course:  Lydia Beard is a 78 y.o. female with medical history significant of HTN, IIDM, HLD, DVT, morbid obesity presented with worsening of bilateral lower extremity rash swelling and pain.  Patient found to have bilateral lower extremity purulent cellulitis.   Assessment and Plan:  Cellulitis of bilateral lower extremity, failed outpatient management - Cellulitis involves more than one third of surface area of bilateral extremities, failed outpatient management, indication for IV antibiotics. - Leukocytosis improving, Bcx NGTD - Continue broad-spectrum antibiotics including vancomycin and cefepime, given the increased risk of Pseudomonas infection in diabetes with chronic wounds Wound care consulted Lower extremity elevation  Type 2 diabetes mellitus - HbA1c 5.7 - Metformin  discontinued while inpatient - SSI, monitor glucose  History of DVT - Continue Xarelto    Essential hypertension - Hold lisinopril  - resume as BP tolerates   Venous stasis Peripheral edema - With worsening of lower extremity edema, changed p.o. Lasix  to IV Lasix  (inc to bid) - Other DDx, normal LVEF and right heart function on recent echocardiogram.   Morbid obesity - BMI= 57 Counseled on weight loss with diet and exercise when medically stable  Peripheral neuropathy- continue gabapentin 600 TID (has been taking 600 TID at home)   DVT prophylaxis: Xarelto   Code Status: Full code  Family Communication: None present at the bedside Disposition Plan: SNF tomorrow    Subjective:  Patient seen and examined at bedside this morning Denies any acute complaints today.  States has a bump on her left side of the neck, likely lipoma -suggest outpatient follow-up Change IV Lasix  to twice daily, anticipate discharge tomorrow to SNF if  stable  Physical Exam:  General: Elderly female laying in bed in no acute distress Respiratory: clear to auscultation bilaterally, no wheezing, no crackles. Normal respiratory effort. Cardiovascular: Regular rate and rhythm, no murmurs / rubs / gallops. 1+ extremity edema Abdomen: no tenderness, no masses palpated. No hepatosplenomegaly. Bowel sounds positive.  Musculoskeletal: no clubbing / cyanosis. No joint deformity upper and lower extremities. Good ROM, no contractures. Normal muscle tone.  Skin: Rash warm and tender to touch of bilateral lower extremity below the knees, with multiple shallow ulcers bilaterally - improved significantly Neurologic: CN 2-12 grossly intact. Sensation intact, DTR normal. Strength 5/5 in all 4.  Psychiatric: Normal judgment and insight. Alert and oriented x 3. Normal mood.       Vitals:   10/16/24 2011 10/17/24 0435 10/17/24 0709 10/17/24 0743  BP: (!) 103/44 (!) 92/43 (!) 117/52 (!) 106/47  Pulse: 73 63 73 71  Resp: 18 18 18 16   Temp: 97.6 F (36.4 C) 97.9 F (36.6 C)  98 F (36.7 C)  TempSrc: Oral Oral    SpO2: 94% 96% 95% 94%  Weight:      Height:        Data Reviewed: X-ray of both tibia and fibula reviewed showing severe right and left knee degenerative disease.  No findings of osteomyelitis    Latest Ref Rng & Units 10/17/2024    3:50 AM 10/16/2024    3:56 AM 10/15/2024    5:32 AM  CBC  WBC 4.0 - 10.5 K/uL 9.7  11.0  12.2   Hemoglobin 12.0 - 15.0 g/dL 87.4  87.2  86.9   Hematocrit 36.0 - 46.0 % 38.9  40.0  38.9  Platelets 150 - 400 K/uL 329  294  292        Latest Ref Rng & Units 10/17/2024    3:50 AM 10/16/2024    3:56 AM 10/14/2024    3:41 AM  BMP  Glucose 70 - 99 mg/dL 887  899  872   BUN 8 - 23 mg/dL 36  35  35   Creatinine 0.44 - 1.00 mg/dL 9.20  9.19  9.03   Sodium 135 - 145 mmol/L 140  143  141   Potassium 3.5 - 5.1 mmol/L 4.2  4.2  4.4   Chloride 98 - 111 mmol/L 107  106  103   CO2 22 - 32 mmol/L 25  26  27     Calcium 8.9 - 10.3 mg/dL 8.7  9.2  9.5       Time spent: 55 minutes  Author: Laree Lock, MD 10/17/2024 2:48 PM  For on call review [email or web address].

## 2024-10-17 NOTE — Plan of Care (Signed)
  Problem: Clinical Measurements: Goal: Diagnostic test results will improve Outcome: Progressing   Problem: Activity: Goal: Risk for activity intolerance will decrease Outcome: Progressing   Problem: Nutrition: Goal: Adequate nutrition will be maintained Outcome: Progressing   Problem: Coping: Goal: Level of anxiety will decrease Outcome: Progressing   Problem: Coping: Goal: Ability to adjust to condition or change in health will improve Outcome: Progressing

## 2024-10-17 NOTE — Progress Notes (Signed)
 Physical Therapy Treatment Patient Details Name: Lydia Beard MRN: [id number] DOB: 1946-08-30 Today's Date: 10/17/2024   History of Present Illness 78 y.o. female with medical history significant of HTN, IIDM, HLD, DVT, morbid obesity presented with worsening of bilateral lower extremity rash swelling and pain.    PT Comments  Patient seen for PT session focused on ambulation tolerance and transfers. Patient required CGA x2 for safety with RW. Pt relies heavily on RW for balance with increased weight bearing through L upper extremity. Tolerated session fairly with no signs of exertion or distress. Main limiting factors today were strength and overall non functional body mass secondary to class III obesity. Interventions aimed at improving activity tolerance. Continued skilled PT recommended to progress toward functional goals and support discharge readiness.    If plan is discharge home, recommend the following: A lot of help with bathing/dressing/bathroom;Assistance with cooking/housework;Help with stairs or ramp for entrance;Assist for transportation   Can travel by private vehicle     Yes  Equipment Recommendations  None recommended by PT    Recommendations for Other Services       Precautions / Restrictions Precautions Precautions: Fall Recall of Precautions/Restrictions: Intact Restrictions Weight Bearing Restrictions Per Provider Order: No     Mobility  Bed Mobility               General bed mobility comments: MINA BLE, extra time to complete    Transfers Overall transfer level: Needs assistance Equipment used: Rolling walker (2 wheels), 2 person hand held assist Transfers: Sit to/from Stand, Bed to chair/wheelchair/BSC (transfer to [facility] for hyigene addtional sit <> stand for cleaning) Sit to Stand: Contact guard assist, +2 safety/equipment Stand pivot transfers: Contact guard assist, Min assist, +2 safety/equipment Step pivot transfers: Contact guard  assist, +2 safety/equipment       General transfer comment: Sit <> stand x2 for short ambulation bouts using RW; leaning heavy on L UE using forearm on RW during standing and ambulation    Ambulation/Gait Ambulation/Gait assistance: Min assist, Contact guard assist, +2 safety/equipment Gait Distance (Feet): 15 Feet Assistive device: Rolling walker (2 wheels) Gait Pattern/deviations: Step-to pattern, Trunk flexed, Narrow base of support, Decreased stride length       General Gait Details: leaning heavy on L UE using forearm on RW during standing and ambulation   Stairs             Wheelchair Mobility     Tilt Bed    Modified Rankin (Stroke Patients Only)       Balance Overall balance assessment: Needs assistance Sitting-balance support: Feet supported Sitting balance-Leahy Scale: Good     Standing balance support: During functional activity, Bilateral upper extremity supported Standing balance-Leahy Scale: Fair Standing balance comment: heavy lean on RW on L side due to swelling and hip pain in L hip                            Communication    Cognition                                        Cueing    Exercises      General Comments        Pertinent Vitals/Pain Pain Assessment Pain Assessment: 0-10 Pain Score: 6  Pain Location: L hip Pain Descriptors / Indicators: Discomfort, Guarding, Aching Pain  Intervention(s): Limited activity within patient's tolerance, Monitored during session, Repositioned    Home Living                          Prior Function            PT Goals (current goals can now be found in the care plan section) Acute Rehab PT Goals Patient Stated Goal: to get stronger and take care of myself PT Goal Formulation: With patient Time For Goal Achievement: 10/29/24 Potential to Achieve Goals: Fair Progress towards PT goals: Progressing toward goals    Frequency    Min 2X/week       PT Plan      Co-evaluation PT/OT/SLP Co-Evaluation/Treatment: Yes Reason for Co-Treatment: For patient/therapist safety PT goals addressed during session: Mobility/safety with mobility OT goals addressed during session: ADL's and self-care      AM-PAC PT 6 Clicks Mobility   Outcome Measure  Help needed turning from your back to your side while in a flat bed without using bedrails?: A Little Help needed moving from lying on your back to sitting on the side of a flat bed without using bedrails?: A Little Help needed moving to and from a bed to a chair (including a wheelchair)?: A Little Help needed standing up from a chair using your arms (e.g., wheelchair or bedside chair)?: A Little Help needed to walk in hospital room?: A Lot Help needed climbing 3-5 steps with a railing? : Total 6 Click Score: 15    End of Session Equipment Utilized During Treatment: Gait belt Activity Tolerance: Patient tolerated treatment well Patient left: with bed alarm set;in chair Nurse Communication: Mobility status PT Visit Diagnosis: Unsteadiness on feet (R26.81);Other abnormalities of gait and mobility (R26.89);Muscle weakness (generalized) (M62.81);Difficulty in walking, not elsewhere classified (R26.2);Pain Pain - Right/Left: Left Pain - part of body: Leg     Time: 8884-8852 PT Time Calculation (min) (ACUTE ONLY): 32 min  Charges:    $Therapeutic Activity: 8-22 mins PT General Charges $$ ACUTE PT VISIT: 1 Visit                     Sherlean Lesches DPT, PT     Sherlean A Jhane Lorio 10/17/2024, 11:59 AM

## 2024-10-17 NOTE — Progress Notes (Signed)
 Occupational Therapy Treatment Patient Details Name: Lydia Beard MRN: [id number] DOB: Dec 24, 1945 Today's Date: 10/17/2024   History of present illness 78 y.o. female with medical history significant of HTN, IIDM, HLD, DVT, morbid obesity presented with worsening of bilateral lower extremity rash swelling and pain.   OT comments  Pt making progress towards goals. Pt requires CGA - MIN A for functional STS transfers, uses momentum to rise to standing and relies heavily on bilateral UE forearm support on RW for short distance mobility in room (+2 present for safety due to body habitus with close chair follow). Step pivot from recliner <> bari BSC for continent void, MAX A for pericare and MAX A for LB dressing. OT will continue to follow, discharge recommendation appropriate.       If plan is discharge home, recommend the following:  Assistance with cooking/housework;Help with stairs or ramp for entrance;Assist for transportation;A lot of help with walking and/or transfers;A lot of help with bathing/dressing/bathroom   Equipment Recommendations  None recommended by OT    Recommendations for Other Services      Precautions / Restrictions Precautions Precautions: Fall Recall of Precautions/Restrictions: Intact Restrictions Weight Bearing Restrictions Per Provider Order: No       Mobility Bed Mobility               General bed mobility comments: NT, pt recieved in recliner    Transfers Overall transfer level: Needs assistance Equipment used: Rolling walker (2 wheels), 2 person hand held assist Transfers: Sit to/from Stand, Bed to chair/wheelchair/BSC Sit to Stand: Contact guard assist, +2 safety/equipment     Step pivot transfers: Contact guard assist, +2 safety/equipment     General transfer comment: relies heavily on BUE forearm support during transfers/amb     Balance Overall balance assessment: Needs assistance Sitting-balance support: Feet  supported Sitting balance-Leahy Scale: Good     Standing balance support: During functional activity, Bilateral upper extremity supported Standing balance-Leahy Scale: Fair Standing balance comment: heavy lean on RW on L side due to swelling and hip pain in L hip                           ADL either performed or assessed with clinical judgement   ADL Overall ADL's : Needs assistance/impaired                     Lower Body Dressing: Maximal assistance;Sitting/lateral leans Lower Body Dressing Details (indicate cue type and reason): don/doff socks. pt does not wear socks at home Toilet Transfer: Rolling walker (2 wheels);BSC/3in1;Ambulation;Contact guard assist;+2 for safety/equipment;Requires wide/bariatric Toilet Transfer Details (indicate cue type and reason): step pivot recliner <> BSC Toileting- Clothing Manipulation and Hygiene: Maximal assistance;Sit to/from stand Toileting - Clothing Manipulation Details (indicate cue type and reason): maxA due to body habitus (pt uses bidet at home)     Functional mobility during ADLs: Rolling walker (2 wheels);+2 for safety/equipment General ADL Comments: functional mobility with heavy BUE forearm support on RW, +2 for safety chair follow due to body habitus. transfers to bari [facility] for continent urine void     Communication Communication Communication: No apparent difficulties   Cognition Arousal: Alert Behavior During Therapy: [facility] for tasks assessed/performed Cognition: No apparent impairments                               Following commands: Intact  Cueing   Cueing Techniques: Verbal cues  Exercises Other Exercises Other Exercises: pt return demos BUE exercises taught yesterday by OT seated in recliner, has been performing independently    Shoulder Instructions       General Comments BLE wrapped in ace bandages    Pertinent Vitals/ Pain       Pain Assessment Pain Assessment:  0-10 Pain Score: 6  Pain Location: L hip Pain Descriptors / Indicators: Discomfort, Guarding, Aching Pain Intervention(s): Limited activity within patient's tolerance   Frequency  Min 2X/week        Progress Toward Goals  OT Goals(current goals can now be found in the care plan section)  Progress towards OT goals: Progressing toward goals  Acute Rehab OT Goals OT Goal Formulation: With patient Time For Goal Achievement: 10/28/24 Potential to Achieve Goals: Fair ADL Goals Pt Will Perform Grooming: with modified independence;standing Pt Will Perform Lower Body Dressing: with modified independence;sit to/from stand Pt Will Transfer to Toilet: with modified independence;ambulating Pt Will Perform Toileting - Clothing Manipulation and hygiene: with modified independence;sit to/from stand  Plan      Co-evaluation    PT/OT/SLP Co-Evaluation/Treatment: Yes Reason for Co-Treatment: For patient/therapist safety PT goals addressed during session: Mobility/safety with mobility OT goals addressed during session: ADL's and self-care      AM-PAC OT 6 Clicks Daily Activity     Outcome Measure   Help from another person eating meals?: None Help from another person taking care of personal grooming?: A Little Help from another person toileting, which includes using toliet, bedpan, or urinal?: A Little Help from another person bathing (including washing, rinsing, drying)?: A Little Help from another person to put on and taking off regular upper body clothing?: None Help from another person to put on and taking off regular lower body clothing?: A Lot 6 Click Score: 19    End of Session Equipment Utilized During Treatment: Gait belt;Rolling walker (2 wheels)  OT Visit Diagnosis: Unsteadiness on feet (R26.81);Repeated falls (R29.6);Muscle weakness (generalized) (M62.81)   Activity Tolerance Patient tolerated treatment well   Patient Left in chair;with call bell/phone within  reach   Nurse Communication Mobility status        Time: 8883-8850 OT Time Calculation (min): 33 min  Charges: OT General Charges $OT Visit: 1 Visit OT Treatments $Self Care/Home Management : 8-22 mins  Lenise Jr L. Conley Delisle, OTR/L  10/17/24, 12:10 PM

## 2024-10-18 LAB — GLUCOSE, CAPILLARY
Glucose-Capillary: 107 mg/dL — ABNORMAL HIGH (ref 70–99)
Glucose-Capillary: 113 mg/dL — ABNORMAL HIGH (ref 70–99)
Glucose-Capillary: 133 mg/dL — ABNORMAL HIGH (ref 70–99)

## 2024-10-18 LAB — BASIC METABOLIC PANEL WITH GFR
Anion gap: 10 (ref 5–15)
BUN: 34 mg/dL — ABNORMAL HIGH (ref 8–23)
CO2: 25 mmol/L (ref 22–32)
Calcium: 8.8 mg/dL — ABNORMAL LOW (ref 8.9–10.3)
Chloride: 106 mmol/L (ref 98–111)
Creatinine, Ser: 0.85 mg/dL (ref 0.44–1.00)
GFR, Estimated: >60 mL/min (ref >60)
Glucose, Bld: 103 mg/dL — ABNORMAL HIGH (ref 70–99)
Potassium: 4.6 mmol/L (ref 3.5–5.1)
Sodium: 141 mmol/L (ref 135–145)

## 2024-10-18 LAB — MAGNESIUM: Magnesium: 1.5 mg/dL — ABNORMAL LOW (ref 1.7–2.4)

## 2024-10-18 MED ORDER — MAGNESIUM SULFATE 2 GM/50ML IV SOLN
2.0000 g | Freq: Once | INTRAVENOUS | Status: AC
  Administered 2024-10-18: 2 g via INTRAVENOUS
  Filled 2024-10-18: qty 50

## 2024-10-18 MED ORDER — OXYCODONE HCL 5 MG PO TABS: 5.0000 mg | ORAL_TABLET | Freq: Four times a day (QID) | ORAL | 0 refills | Status: AC | PRN

## 2024-10-18 MED ORDER — ACETAMINOPHEN 325 MG PO TABS: 650.0000 mg | ORAL_TABLET | Freq: Four times a day (QID) | ORAL | 0 refills | Status: AC | PRN

## 2024-10-18 MED ORDER — LINEZOLID 600 MG PO TABS: 600.0000 mg | ORAL_TABLET | Freq: Two times a day (BID) | ORAL | Status: AC

## 2024-10-18 MED ORDER — GABAPENTIN 300 MG PO CAPS: 600.0000 mg | ORAL_CAPSULE | Freq: Three times a day (TID) | ORAL | Status: AC

## 2024-10-18 MED ORDER — CEFPODOXIME PROXETIL 200 MG PO TABS: 400.0000 mg | ORAL_TABLET | Freq: Two times a day (BID) | ORAL | Status: AC

## 2024-10-18 NOTE — Consult Note (Addendum)
 Pharmacy Antibiotic Note  Lydia Beard is a 78 y.o. female admitted on 10/14/2024 with cellulitis of lower extremities.  Pharmacy has been consulted for Vancomycin and Cefepime dosing.  Plan: Continue vancomycin 1750mg  IV q24H eAUC 450, Cmax 32, Cmin 11 Scr 0.85, IBW, Vd 0.5 L/kg Continue cefepime 2g IV q8H Transition to linezolid 600mg  PO twice daily and cefpodoxime 400mg  PO twice daily at discharge per discussion with provider Orders for both discharge antibiotics have been pended Complete 7 days of antibiotic therapy   Height: 5' 6 (167.6 cm) Weight: (!) 161.5 kg (356 lb) IBW/kg (Calculated) : 59.3  Temp (24hrs), Avg:98.1 F (36.7 C), Min:97.8 F (36.6 C), Max:98.5 F (36.9 C)  Recent Labs  Lab 10/14/24 0341 10/14/24 0735 10/15/24 0532 10/16/24 0356 10/17/24 0350 10/18/24 0345  WBC 13.0*  --  12.2* 11.0* 9.7  --   CREATININE 0.96  --   --  0.80 0.79 0.85  LATICACIDVEN 1.6 1.6  --   --   --   --     Estimated Creatinine Clearance: 89.1 mL/min (by C-G formula based on SCr of 0.85 mg/dL).    Allergies  Allergen Reactions   Codeine Nausea And Vomiting   Erythromycin Other (See Comments)   Penicillins Swelling    Antimicrobials this admission: Vancomycin 10/27 >>  Cefepime 10/27 >>   Dose adjustments this admission: n/a  Microbiology results: 10/28 Bcx: NGTD after 3 days 10/27 MRSA PCR: negative  Thank you for allowing pharmacy to be a part of this patient's care.  Will M. Lenon, PharmD, BCPS Clinical Pharmacist 10/18/2024 11:21 AM

## 2024-10-18 NOTE — Plan of Care (Signed)

## 2024-10-18 NOTE — TOC Transition Note (Signed)
 Transition of Care [facility]) - Discharge Note   Patient Details  Name: Lydia Beard MRN: [id number] Date of Birth: [birth year]  [facility]) CM/SW Contact:  Alvaro Louder, LCSW Phone Number: 10/18/2024, 4:38 PM   Clinical Narrative:   LCSWA received insurance approval for patient to admit to SN[facility]. LCSWA confirmed with MD that patient is stable for discharge. LCSWA notified the patient and they are in agreement with discharge. LCSWA confirmed bed is available at SNF. Transport arranged with [facility] for next available.  RM [id number], number for report [phone]   [facility] Signing off        Patient Goals and CMS Choice            Discharge Placement                       Discharge Plan and Services Additional resources added to the After Visit Summary for                                       Social Drivers of Health (SDOH) Interventions SDOH Screenings   Food Insecurity: No Food Insecurity (10/14/2024)  Housing: Low Risk  (10/14/2024)  Transportation Needs: Unmet Transportation Needs (10/14/2024)  Utilities: Not At Risk (10/14/2024)  Depression (PHQ2-9): Low Risk  (06/04/2019)  Financial Resource Strain: Low Risk  (10/07/2024)   Received from [facility]  Physical Activity: Inactive (10/07/2024)   Received from [facility]  Social Connections: Socially Isolated (10/14/2024)  Stress: Stress Concern Present (10/07/2024)   Received from [facility]  Tobacco Use: Medium Risk (10/17/2024)  Health Literacy: Low Risk  (10/07/2024)   Received from [facility]     Readmission Risk Interventions     No data to display

## 2024-10-18 NOTE — Discharge Summary (Signed)
 Physician Discharge Summary   Patient: Lydia Beard MRN: [id number] DOB: 02-Jul-1946  Admit date:     10/14/2024  Discharge date: 10/18/24  Discharge Physician: Laree Lock   PCP: Geralene Levorn ORN, NP   Recommendations at discharge:   Follow-up with SNF provider -repeat BMP, mag in 3 days Monitor BP - resume lisinopril  as needed  Discharge Diagnoses: Principal Problem:   Cellulitis   Hospital Course:  Lydia Beard is a 78 y.o. female with medical history significant of HTN, IIDM, HLD, DVT, morbid obesity presented with worsening of bilateral lower extremity rash swelling and pain.  Patient found to have bilateral lower extremity purulent cellulitis.Hospital course as below   Cellulitis of bilateral lower extremity, failed outpatient management - Cellulitis involves more than one third of surface area of bilateral extremities, failed outpatient management - Leukocytosis resolved - was on IV vancomycin and cefepime, discharged on p.o. linezolid, cefpodoxime to complete 7-days - Seen by WOC   Type 2 diabetes mellitus - HbA1c 5.7, On Metformin  at home   History of DVT - Continue Xarelto    Essential hypertension - Hold lisinopril  - resume as BP tolerates   Venous stasis Peripheral edema - With worsening of lower extremity edema, s/p IV diuresis, on po Lasix  - Held potassium supplements, resume as needed - Other DDx, normal LVEF and right heart function on recent echocardiogram.  Hypomagnesemia - repleted with IV mag, follow up labs at SNF   Morbid obesity - BMI= 57 Counseled on weight loss with diet and exercise   Peripheral neuropathy- Increase gabapentin 600 TID (has been taking 600 TID at home)   Pain control - [place]  Controlled Substance Reporting System database was reviewed. and patient was instructed, not to drive, operate heavy machinery, perform activities at heights, swimming or participation in water activities or provide baby-sitting  services while on Pain, Sleep and Anxiety Medications; until their outpatient Physician has advised to do so again. Also recommended to not to take more than prescribed Pain, Sleep and Anxiety Medications.  Consultants: None Procedures performed: None  Disposition: Skilled nursing facility Diet recommendation:  Discharge Diet Orders (From admission, onward)     Start     Ordered   10/18/24 0000  Diet - low sodium heart healthy        10/18/24 1302            DISCHARGE MEDICATION: Allergies as of 10/18/2024       Reactions   Codeine Nausea And Vomiting   Erythromycin Other (See Comments)   Penicillins Swelling        Medication List     PAUSE taking these medications    lisinopril  40 MG tablet Wait to take this until your doctor or other care provider tells you to start again. Commonly known as: ZESTRIL  TAKE 1 TABLET DAILY   potassium chloride  10 MEQ tablet Wait to take this until your doctor or other care provider tells you to start again. Commonly known as: KLOR-CON  M Take 10 mEq by mouth daily.       STOP taking these medications    doxycycline  100 MG tablet Commonly known as: VIBRA -TABS   oxycodone  5 MG capsule Commonly known as: OXY-IR Replaced by: oxyCODONE  5 MG immediate release tablet       TAKE these medications    acetaminophen  325 MG tablet Commonly known as: TYLENOL  Take 2 tablets (650 mg total) by mouth every 6 (six) hours as needed for mild pain (pain score 1-3) or  fever (or Fever >/= 101).   cefpodoxime 200 MG tablet Commonly known as: VANTIN Take 2 tablets (400 mg total) by mouth 2 (two) times daily for 5 doses.   furosemide  40 MG tablet Commonly known as: LASIX  TAKE 1 TABLET DAILY   gabapentin 300 MG capsule Commonly known as: NEURONTIN Take 2 capsules (600 mg total) by mouth 3 (three) times daily. What changed: how much to take   linezolid 600 MG tablet Commonly known as: ZYVOX Take 1 tablet (600 mg total) by mouth 2  (two) times daily for 5 doses.   metFORMIN  1000 MG tablet Commonly known as: GLUCOPHAGE  TAKE 1 TABLET TWICE A DAY WITH MEALS   multivitamin tablet Take 1 tablet by mouth daily.   naproxen sodium 220 MG tablet Commonly known as: ALEVE Take 220 mg by mouth daily as needed.   omeprazole  40 MG capsule Commonly known as: PRILOSEC TAKE 1 CAPSULE DAILY   oxyCODONE  5 MG immediate release tablet Commonly known as: Oxy IR/ROXICODONE  Take 1 tablet (5 mg total) by mouth every 6 (six) hours as needed for moderate pain (pain score 4-6) or severe pain (pain score 7-10). Replaces: oxycodone  5 MG capsule   rivaroxaban  20 MG Tabs tablet Commonly known as: XARELTO  Take 20 mg by mouth daily with supper. What changed: Another medication with the same name was removed. Continue taking this medication, and follow the directions you see here.   tiZANidine  4 MG tablet Commonly known as: ZANAFLEX  TAKE ONE-HALF (1/2) TABLET DAILY What changed:  how much to take when to take this               Discharge Care Instructions  (From admission, onward)           Start     Ordered   10/18/24 0000  Discharge wound care:       Comments: Wound care  Daily      Comments: Cleanse B lower leg wounds with Vashe wound cleanser Soila [phone]) do not rinse and allow to air dry. Apply silver hydrofiber ([place] [phone]) cut to fit wound beds daily, cover with ABD pad and secure with kerlix roll gauze beginning right above toes and ending right below knees.  Apply Ace bandage wrapped in same fashion as Kerlix for light compression.  Elevate legs as much as possible.  SOAK SILVER IN NS IF ADHERED TO WOUND BEDS FOR ATRAUMATIC REMOVAL   10/18/24 1302            Contact information for after-discharge care     Destination     [facility]   Service: Skilled Nursing Contact information: [address] [place]  [phone]                     Discharge Exam: Lydia Beard   10/14/24 0330  Weight: (!) 161.5 kg   General: Elderly female laying in bed in no acute distress Respiratory: clear to auscultation bilaterally Cardiovascular: Regular rate and rhythm, no murmurs / rubs / gallops. 1+ extremity edema Abdomen: no tenderness, no masses palpated. No hepatosplenomegaly. Bowel sounds positive.  Musculoskeletal: no clubbing / cyanosis. No joint deformity upper and lower extremities. Good ROM, no contractures. Normal muscle tone.  Skin: Rash warm and tender to touch of bilateral lower extremity below the knees, with multiple shallow ulcers bilaterally - improved significantly Neurologic: CN 2-12 grossly intact. Sensation intact, DTR normal. Strength 5/5 in all 4.  Psychiatric: Normal judgment and insight.  Alert and oriented x 3. Normal mood  Condition at discharge: good  The results of significant diagnostics from this hospitalization (including imaging, microbiology, ancillary and laboratory) are listed below for reference.   Imaging Studies: DG Tibia/Fibula Right Result Date: 10/14/2024 CLINICAL DATA:  Bilateral lower leg wound infections. EXAM: DG TIBIA/FIBULA 2V*R* COMPARISON:  None Available. FINDINGS: Diffuse soft tissue swelling. No soft tissue gas, bone destruction or periosteal reaction. Severe right knee degenerative changes. IMPRESSION: 1. Diffuse soft tissue swelling without radiographic evidence of osteomyelitis. 2. Severe right knee degenerative changes. Electronically Signed   By: Elspeth Bathe M.D.   On: 10/14/2024 13:29   DG Tibia/Fibula Left Result Date: 10/14/2024 CLINICAL DATA:  Bilateral lower leg wound infection. EXAM: LEFT TIBIA AND FIBULA - 2 VIEW COMPARISON:  None Available. FINDINGS: Diffuse soft tissue swelling. No soft tissue gas, bone destruction or periosteal reaction. Severe left knee degenerative changes. IMPRESSION: 1. Diffuse soft tissue swelling without radiographic evidence of osteomyelitis. 2.  Severe left knee degenerative changes. Electronically Signed   By: Elspeth Bathe M.D.   On: 10/14/2024 13:29   US  Venous Img Lower Bilateral Result Date: 10/04/2024 CLINICAL DATA:  78 year old female with history of left lower extremity DVT. EXAM: BILATERAL LOWER EXTREMITY VENOUS DOPPLER ULTRASOUND TECHNIQUE: Gray-scale sonography with graded compression, as well as color Doppler and duplex ultrasound were performed to evaluate the lower extremity deep venous systems from the level of the common femoral vein and including the common femoral, femoral, profunda femoral, popliteal and calf veins including the posterior tibial, peroneal and gastrocnemius veins when visible. The superficial great saphenous vein was also interrogated. Spectral Doppler was utilized to evaluate flow at rest and with distal augmentation maneuvers in the common femoral, femoral and popliteal veins. COMPARISON:  08/02/2024 FINDINGS: RIGHT LOWER EXTREMITY Common Femoral Vein: No evidence of thrombus. Normal compressibility, respiratory phasicity and response to augmentation. Saphenofemoral Junction: No evidence of thrombus. Normal compressibility and flow on color Doppler imaging. Profunda Femoral Vein: No evidence of thrombus. Normal compressibility and flow on color Doppler imaging. Femoral Vein: No evidence of thrombus. Normal compressibility, respiratory phasicity and response to augmentation. Popliteal Vein: No evidence of thrombus. Normal compressibility, respiratory phasicity and response to augmentation. Calf Veins: Poor visualization of the peroneal vein. Otherwise no evidence of thrombus. Normal compressibility and flow on color Doppler imaging. Other Findings:  None. LEFT LOWER EXTREMITY Common Femoral Vein: No evidence of thrombus. Normal compressibility, respiratory phasicity and response to augmentation. Saphenofemoral Junction: No evidence of thrombus. Normal compressibility and flow on color Doppler imaging. Profunda Femoral  Vein: No evidence of thrombus. Normal compressibility and flow on color Doppler imaging. Femoral Vein: No evidence of thrombus. Normal compressibility, respiratory phasicity and response to augmentation. Popliteal Vein: No evidence of thrombus. Normal compressibility, respiratory phasicity and response to augmentation. Calf Veins: Poor visualization of the peroneal vein. Otherwise no evidence of thrombus. Normal compressibility and flow on color Doppler imaging. Other Findings: Simple appearing fluid collection about the popliteal fossa measuring approximately 4.5 x 1.6 x 2.7 cm. IMPRESSION: 1. No evidence of bilateral lower extremity deep venous thrombosis. 2. Simple appearing left Baker cyst measuring up to 4.5 cm. Ester Sides, MD [facility] Electronically Signed   By: Ester Sides M.D.   On: 10/04/2024 15:47   DG Chest 2 View Result Date: 10/04/2024 EXAM: 2 VIEW(S) XRAY OF THE CHEST 10/04/2024 12:09:00 PM COMPARISON: 08:15 08/02/2024. CLINICAL HISTORY: Concern for infection. Patient states bilateral leg redness and swelling for  2-3 days. FINDINGS: LUNGS AND PLEURA: No focal pulmonary opacity. No overt pulmonary edema. No pleural effusion. No pneumothorax. HEART AND MEDIASTINUM: Cardiomediastinal silhouette is unchanged. BONES AND SOFT TISSUES: No acute osseous abnormality. IMPRESSION: 1. No acute cardiopulmonary findings. Electronically signed by: Harrietta Sherry MD 10/04/2024 12:35 PM EDT RP Workstation: [id number]    Microbiology: Results for orders placed or performed during the hospital encounter of 10/14/24  MRSA Next Gen by PCR, Nasal     Status: None   Collection Time: 10/14/24  9:15 AM   Specimen: Nasal Mucosa; Nasal Swab  Result Value Ref Range Status   MRSA by PCR Next Gen NOT DETECTED NOT DETECTED Final    Comment: (NOTE) The GeneXpert MRSA Assay (FDA approved for NASAL specimens only), is one component of a comprehensive  MRSA colonization surveillance program. It is not intended to diagnose MRSA infection nor to guide or monitor treatment for MRSA infections. Test performance is not FDA approved in patients less than 67 years old. Performed at [facility], [address] [place], [place] [address]   Culture, blood (Routine X 2) w Reflex to ID Panel     Status: None (Preliminary result)   Collection Time: 10/15/24  3:42 PM   Specimen: BLOOD  Result Value Ref Range Status   Specimen Description BLOOD BLOOD RIGHT HAND  Final   Special Requests   Final    BOTTLES DRAWN AEROBIC AND ANAEROBIC Blood Culture adequate volume   Culture   Final    NO GROWTH 3 DAYS Performed at [facility], [address] [place], [place] [address]    Report Status PENDING  Incomplete  Culture, blood (Routine X 2) w Reflex to ID Panel     Status: None (Preliminary result)   Collection Time: 10/15/24  3:44 PM   Specimen: BLOOD  Result Value Ref Range Status   Specimen Description BLOOD BLOOD LEFT HAND  Final   Special Requests   Final    BOTTLES DRAWN AEROBIC AND ANAEROBIC Blood Culture adequate volume   Culture   Final    NO GROWTH 3 DAYS Performed at [facility], [address] [place], [place] [address]    Report Status PENDING  Incomplete    Labs: CBC: Recent Labs  Lab 10/14/24 0341 10/15/24 0532 10/16/24 0356 10/17/24 0350  WBC 13.0* 12.2* 11.0* 9.7  NEUTROABS 9.3*  --  7.3  --   HGB 13.8 13.0 12.7 12.5  HCT 44.3 38.9 40.0 38.9  MCV 91.5 89.0 90.5 89.8  PLT 303 292 294 329   Basic Metabolic Panel: Recent Labs  Lab 10/14/24 0341 10/16/24 0356 10/17/24 0350 10/18/24 0345  NA 141 143 140 141  K 4.4 4.2 4.2 4.6  CL 103 106 107 106  CO2 27 26 25 25   GLUCOSE 127* 100* 112* 103*  BUN 35* 35* 36* 34*  CREATININE 0.96 0.80 0.79 0.85  CALCIUM 9.5 9.2 8.7* 8.8*  MG  --   --   --  1.5*   Liver Function Tests: No results for input(s): AST, ALT, ALKPHOS, BILITOT,  PROT, ALBUMIN in the last 168 hours. CBG: Recent Labs  Lab 10/17/24 1150 10/17/24 1612 10/17/24 2049 10/18/24 0802 10/18/24 1219  GLUCAP 100* 108* 184* 107* 113*    Discharge time spent: greater than 30 minutes.  Signed: Laree Lock, MD [facility] 10/18/2024

## 2024-10-20 LAB — CULTURE, BLOOD (ROUTINE X 2)
Culture: NO GROWTH
Culture: NO GROWTH
Special Requests: ADEQUATE
Special Requests: ADEQUATE
